# Patient Record
Sex: Female | Born: 2000 | Race: Black or African American | Hispanic: No | Marital: Single | Smoking: Never smoker
Health system: Southern US, Community
[De-identification: ages and names within clinical notes are randomized; demographics above are authoritative.]

## PROBLEM LIST (undated history)

## (undated) ENCOUNTER — Emergency Department (HOSPITAL_COMMUNITY): Admission: EM | Payer: Federal, State, Local not specified - PPO | Source: Home / Self Care

## (undated) DIAGNOSIS — Z789 Other specified health status: Secondary | ICD-10-CM

## (undated) HISTORY — PX: NO PAST SURGERIES: SHX2092

---

## 2018-10-09 ENCOUNTER — Emergency Department (HOSPITAL_COMMUNITY): Payer: Federal, State, Local not specified - PPO

## 2018-10-09 ENCOUNTER — Encounter (HOSPITAL_COMMUNITY): Payer: Self-pay

## 2018-10-09 ENCOUNTER — Other Ambulatory Visit: Payer: Self-pay

## 2018-10-09 ENCOUNTER — Emergency Department (HOSPITAL_COMMUNITY)
Admission: EM | Admit: 2018-10-09 | Discharge: 2018-10-09 | Disposition: A | Discharge status: Home / Self Care | Payer: Federal, State, Local not specified - PPO | Attending: Emergency Medicine | Admitting: Emergency Medicine

## 2018-10-09 DIAGNOSIS — Z3A14 14 weeks gestation of pregnancy: Secondary | ICD-10-CM | POA: Insufficient documentation

## 2018-10-09 DIAGNOSIS — O209 Hemorrhage in early pregnancy, unspecified: Secondary | ICD-10-CM | POA: Diagnosis present

## 2018-10-09 DIAGNOSIS — O039 Complete or unspecified spontaneous abortion without complication: Secondary | ICD-10-CM

## 2018-10-09 LAB — CBC
HCT: 35 % — ABNORMAL LOW (ref 36.0–46.0)
Hemoglobin: 11.1 g/dL — ABNORMAL LOW (ref 12.0–15.0)
MCH: 30 pg (ref 26.0–34.0)
MCHC: 31.7 g/dL (ref 30.0–36.0)
MCV: 94.6 fL (ref 80.0–100.0)
Platelets: 277 10*3/uL (ref 150–400)
RBC: 3.7 MIL/uL — ABNORMAL LOW (ref 3.87–5.11)
RDW: 12.9 % (ref 11.5–15.5)
WBC: 4 10*3/uL (ref 4.0–10.5)
nRBC: 0 % (ref 0.0–0.2)

## 2018-10-09 LAB — I-STAT BETA HCG BLOOD, ED (MC, WL, AP ONLY): I-stat hCG, quantitative: >2000 m[IU]/mL — ABNORMAL HIGH (ref <5)

## 2018-10-09 NOTE — Discharge Instructions (Addendum)
Please follow-up with your obstetrician tomorrow as scheduled.  These return to the hospital for any new or worsening symptoms including worsening vaginal bleeding or development of severe abdominal pain

## 2018-10-09 NOTE — ED Notes (Signed)
Pt discharged from ED; instructions provided; Pt encouraged to return to ED if symptoms worsen and to f/u with PCP; Pt verbalized understanding of all instructions 

## 2018-10-09 NOTE — ED Triage Notes (Signed)
Pt arrives with c/o sudden onset of severe abd cramping and heavy vaginal bleeding that started at approx 6a.

## 2018-10-09 NOTE — ED Provider Notes (Signed)
[facility] EMERGENCY DEPARTMENT Provider Note   CSN: [id number] Arrival date & time: 10/09/18  2952     History   Chief Complaint Chief Complaint  Patient presents with  . Vaginal Bleeding  . Abdominal Pain    HPI Allison Williamson is a 18 y.o. female.  HPI Patient is a 18 year old G1, P0 female presenting to the emergency department with vaginal bleeding and abdominal cramping.  Based on a first trimester ultrasound obtained at the [facility] hospital on 09/12/2018 she is currently 14 weeks and 1 day today.  She reports passage of bright red blood and blood clots since last night.  She feels like her bleeding is slowing down.  Expected date of delivery based on that first trimester ultrasound is April 08, 2019.  No vomiting.  No fevers.  No chills.   History reviewed. No pertinent past medical history.  There are no active problems to display for this patient.   ** The histories are not reviewed yet. Please review them in the "History" navigator section and refresh this SmartLink.   OB History    Gravida  1   Para      Term      Preterm      AB      Living        SAB      TAB      Ectopic      Multiple      Live Births               Home Medications    Prior to Admission medications   Not on File    Family History History reviewed. No pertinent family history.  Social History Social History   Tobacco Use  . Smoking status: Not on file  Substance Use Topics  . Alcohol use: Not on file  . Drug use: Not on file     Allergies   Patient has no allergy information on record.   Review of Systems Review of Systems  All other systems reviewed and are negative.    Physical Exam Updated Vital Signs BP (!) 97/56   Pulse 77   SpO2 100%   Physical Exam Vitals signs and nursing note reviewed.  Constitutional:      Appearance: She is well-developed.  HENT:     Head: Normocephalic.  Neck:     Musculoskeletal:  Normal range of motion.  Cardiovascular:     Rate and Rhythm: Normal rate.  Pulmonary:     Effort: Pulmonary effort is normal.  Abdominal:     General: There is no distension.     Palpations: Abdomen is soft. There is no mass.  Musculoskeletal: Normal range of motion.  Neurological:     Mental Status: She is alert and oriented to person, place, and time.      ED Treatments / Results  Labs (all labs ordered are listed, but only abnormal results are displayed) Labs Reviewed  CBC - Abnormal; Notable for the following components:      Result Value   RBC 3.70 (*)    Hemoglobin 11.1 (*)    HCT 35.0 (*)    All other components within normal limits  I-STAT BETA HCG BLOOD, ED ([facility], [facility], [facility] ONLY) - Abnormal; Notable for the following components:   I-stat hCG, quantitative >2,000.0 (*)    All other components within normal limits    EKG None  Radiology US Ob Comp < 14 Wks  Result Date: 10/09/2018 CLINICAL DATA:  Vaginal bleeding EXAM: OBSTETRIC <14 WK [place] AND TRANSVAGINAL OB US TECHNIQUE: Both transabdominal and transvaginal ultrasound examinations were performed for complete evaluation of the gestation as well as the maternal uterus, adnexal regions, and pelvic cul-de-sac. Transvaginal technique was performed to assess early pregnancy. COMPARISON:  None. FINDINGS: Intrauterine gestational sac: None Yolk sac:  Not visualized Embryo:  Not visualized Cardiac Activity: Not visualized Subchorionic hemorrhage:  None visualized. Maternal uterus/adnexae: Right ovary: Appears normal Left ovary: Small cyst measures 1.3 x 1.3 x 1.3 cm. Other :A small hypoechoic structure within the endometrium measures 1.2 x 0.5 x 0.6 cm. Free fluid:  Trace IMPRESSION: 1. No intrauterine gestational sac, yolk sac, or fetal pole identified. Differential considerations include intrauterine pregnancy too early to be sonographically visualized, missed abortion, or ectopic pregnancy. Followup ultrasound is recommended in  10-14 days for further evaluation. 2. Small hypoechoic structure within the endometrium may be related to patient's heavy vaginal bleeding. Electronically Signed   By: Signa Kell M.D.   On: 10/09/2018 10:46   US Ob Transvaginal  Result Date: 10/09/2018 CLINICAL DATA:  Vaginal bleeding EXAM: OBSTETRIC <14 WK [place] AND TRANSVAGINAL OB US TECHNIQUE: Both transabdominal and transvaginal ultrasound examinations were performed for complete evaluation of the gestation as well as the maternal uterus, adnexal regions, and pelvic cul-de-sac. Transvaginal technique was performed to assess early pregnancy. COMPARISON:  None. FINDINGS: Intrauterine gestational sac: None Yolk sac:  Not visualized Embryo:  Not visualized Cardiac Activity: Not visualized Subchorionic hemorrhage:  None visualized. Maternal uterus/adnexae: Right ovary: Appears normal Left ovary: Small cyst measures 1.3 x 1.3 x 1.3 cm. Other :A small hypoechoic structure within the endometrium measures 1.2 x 0.5 x 0.6 cm. Free fluid:  Trace IMPRESSION: 1. No intrauterine gestational sac, yolk sac, or fetal pole identified. Differential considerations include intrauterine pregnancy too early to be sonographically visualized, missed abortion, or ectopic pregnancy. Followup ultrasound is recommended in 10-14 days for further evaluation. 2. Small hypoechoic structure within the endometrium may be related to patient's heavy vaginal bleeding. Electronically Signed   By: Signa Kell M.D.   On: 10/09/2018 10:46   ++++++++++++++++++++++++++++++++++++++++++++++ [facility] US OB < 14 weeks obtained 09/12/2018 US Ob < 14 Weeks Single Or FIRst Gestation12/30/2019 [facility] Result Impression  IUP + [facility]  Result Narrative  *pt here for new ob dating u/s. LMP = 07/03/18; G=1. Patient denies any spotting  US OB 1st trimester imaging performed:  Single live IUP seen   EDD by LMP = 04/09/19 @ 10.1wks  EDD by CRL = 04/08/19 @ 10.2wks (+/-3 days)  Fetal  heart rate = 175 bpm  Anteverted uterus; no masses seen   Cervix is long and closed at 3.46cm  Subchorionic bleed noted at inferior aspect of gestational sac; measures 1.42cm in greatest dimension  Bilateral ovaries appear normal  No free fluid seen   *pt has initial ob provider visit today with Barnie Alderman, CNM  +++++++++++++++++++++++++++++++++++++++++++++++++++++++++++++++++++++    Procedures Procedures (including critical care time)  Medications Ordered in ED Medications - No data to display   Initial Impression / Assessment and Plan / ED Course  I have reviewed the triage vital signs and the nursing notes.  Pertinent labs & imaging results that were available during my care of the patient were reviewed by me and considered in my medical decision making (see chart for details).     Ultrasound today is consistent with miscarriage.  There is no fetal pole, no intrauterine  gestational sac, yolk sac.  Vital signs are stable.  Based on her ultrasound attained on 09/12/2018 she would be 14 weeks and 1 day.  Patient however is under the belief that she is [redacted] weeks pregnant based on prior imaging obtained by pregnancy care center.  She does not remember however passing what looks like a fetus today . she has just transitioned her care to a [facility] gynecologist [place] [employer].  She has a scheduled appointment tomorrow.  Patient will be discharged home with a copy of her laboratory studies as well as her ultrasound report.  She has been given a copy of her ultrasound on disc for her obstetrician tomorrow.  Final Clinical Impressions(s) / ED Diagnoses   Final diagnoses:  None    ED Discharge Orders    None       Azalia Bilisampos, Shaquon Gropp, MD 10/09/18 1141

## 2018-10-15 ENCOUNTER — Encounter (HOSPITAL_COMMUNITY): Payer: Self-pay

## 2018-10-15 ENCOUNTER — Other Ambulatory Visit: Payer: Self-pay

## 2018-10-15 ENCOUNTER — Emergency Department (HOSPITAL_COMMUNITY)
Admission: EM | Admit: 2018-10-15 | Discharge: 2018-10-15 | Disposition: A | Discharge status: Home / Self Care | Payer: Federal, State, Local not specified - PPO | Attending: Emergency Medicine | Admitting: Emergency Medicine

## 2018-10-15 DIAGNOSIS — O039 Complete or unspecified spontaneous abortion without complication: Secondary | ICD-10-CM | POA: Insufficient documentation

## 2018-10-15 DIAGNOSIS — Z3A00 Weeks of gestation of pregnancy not specified: Secondary | ICD-10-CM | POA: Insufficient documentation

## 2018-10-15 DIAGNOSIS — O209 Hemorrhage in early pregnancy, unspecified: Secondary | ICD-10-CM | POA: Diagnosis present

## 2018-10-15 LAB — URINALYSIS, ROUTINE W REFLEX MICROSCOPIC
BILIRUBIN URINE: NEGATIVE
Glucose, UA: NEGATIVE mg/dL
HGB URINE DIPSTICK: NEGATIVE
Ketones, ur: NEGATIVE mg/dL
Leukocytes, UA: NEGATIVE
Nitrite: NEGATIVE
Protein, ur: NEGATIVE mg/dL
Specific Gravity, Urine: 1.024 (ref 1.005–1.030)
pH: 6 (ref 5.0–8.0)

## 2018-10-15 LAB — CBC
HCT: 39.5 % (ref 36.0–46.0)
Hemoglobin: 12.4 g/dL (ref 12.0–15.0)
MCH: 31.1 pg (ref 26.0–34.0)
MCHC: 31.4 g/dL (ref 30.0–36.0)
MCV: 99 fL (ref 80.0–100.0)
Platelets: 345 10*3/uL (ref 150–400)
RBC: 3.99 MIL/uL (ref 3.87–5.11)
RDW: 13.1 % (ref 11.5–15.5)
WBC: 3.9 10*3/uL — ABNORMAL LOW (ref 4.0–10.5)
nRBC: 0 % (ref 0.0–0.2)

## 2018-10-15 LAB — ABO/RH: ABO/RH(D): O POS

## 2018-10-15 LAB — HCG, QUANTITATIVE, PREGNANCY: hCG, Beta Chain, Quant, S: 202 m[IU]/mL — ABNORMAL HIGH (ref <5)

## 2018-10-15 NOTE — ED Triage Notes (Signed)
Pt telling RN that she is mainly here because she has not been able to sleep.  Cramping from miscarriage has decreased her appetite.  No n/v.  Pt states vaginal bleeding has lessened

## 2018-10-15 NOTE — ED Notes (Signed)
IV removed from right AC

## 2018-10-15 NOTE — ED Provider Notes (Signed)
[facility]-EMERGENCY DEPT Provider Note   CSN: [id number] Arrival date & time: 10/15/18  1051     History   Chief Complaint Chief Complaint  Patient presents with  . Insomnia  . Abdominal Cramping    HPI Allison Williamson is a 18 y.o. female.  HPI Patient reports that vaginal bleeding stopped yesterday.  She reports she still having some lower abdominal cramping and some low back pain.  No nausea no vomiting.  No pain no burning with urination.  Patient was seen 6 days ago for vaginal bleeding and cramping with estimated 14 weeks and 1 days gestation.  At that time she had been passing bright red blood and large clots. History reviewed. No pertinent past medical history.  There are no active problems to display for this patient.   History reviewed. No pertinent surgical history.   OB History    Gravida  1   Para      Term      Preterm      AB      Living        SAB      TAB      Ectopic      Multiple      Live Births               Home Medications    Prior to Admission medications   Not on File    Family History History reviewed. No pertinent family history.  Social History Social History   Tobacco Use  . Smoking status: Never Smoker  . Smokeless tobacco: Never Used  Substance Use Topics  . Alcohol use: Never    Frequency: Never  . Drug use: Never     Allergies   Patient has no known allergies.   Review of Systems Review of Systems 10 Systems reviewed and are negative for acute change except as noted in the HPI.   Physical Exam Updated Vital Signs BP 105/74 (BP Location: Right Arm)   Pulse 86   Temp 98.6 F (37 C) (Oral)   Resp 18   LMP  (LMP Unknown) Comment: recent pregnancy, current miscarriage  SpO2 100%   Physical Exam Constitutional:      Appearance: Normal appearance. She is well-developed.  HENT:     Head: Normocephalic and atraumatic.  Eyes:     Extraocular Movements: Extraocular  movements intact.     Conjunctiva/sclera: Conjunctivae normal.  Neck:     Musculoskeletal: Neck supple.  Cardiovascular:     Rate and Rhythm: Normal rate and regular rhythm.     Heart sounds: Normal heart sounds.  Pulmonary:     Effort: Pulmonary effort is normal.     Breath sounds: Normal breath sounds.  Abdominal:     General: Bowel sounds are normal. There is no distension.     Palpations: Abdomen is soft.     Tenderness: There is no abdominal tenderness.  Musculoskeletal: Normal range of motion.  Skin:    General: Skin is warm and dry.  Neurological:     Mental Status: She is alert and oriented to person, place, and time.     GCS: GCS eye subscore is 4. GCS verbal subscore is 5. GCS motor subscore is 6.     Coordination: Coordination normal.      ED Treatments / Results  Labs (all labs ordered are listed, but only abnormal results are displayed) Labs Reviewed  HCG, QUANTITATIVE, PREGNANCY - Abnormal; Notable for the  following components:      Result Value   hCG, Beta Chain, Quant, S 202 (*)    All other components within normal limits  CBC - Abnormal; Notable for the following components:   WBC 3.9 (*)    All other components within normal limits  URINALYSIS, ROUTINE W REFLEX MICROSCOPIC  ABO/RH    EKG None  Radiology No results found.  Procedures Procedures (including critical care time)  Medications Ordered in ED Medications - No data to display   Initial Impression / Assessment and Plan / ED Course  I have reviewed the triage vital signs and the nursing notes.  Pertinent labs & imaging results that were available during my care of the patient were reviewed by me and considered in my medical decision making (see chart for details).    Patient is clinically well in appearance.  Abdomen is soft and nontender.  hCG has dropped as anticipated.  At this point findings consistent with complete AB.  Patient is no longer bleeding.  She is stable for follow-up  with GYN.  Patient reports she is having trouble falling asleep for about the past week.  We discussed trying melatonin and cognitive techniques.  Patient is to discuss this with PCP if persist beyond the next few days.  Final Clinical Impressions(s) / ED Diagnoses   Final diagnoses:  Complete abortion    ED Discharge Orders    None       Arby Barrette, MD 10/15/18 1556

## 2018-10-15 NOTE — ED Triage Notes (Addendum)
Per EMS, pt from [facility].  [facility]  Seen [facility] for [facility] recently.  Pt has had vaginal bleeding x 8 days.   No fever.  Having abdominal pain.  Vitals:   130/73, hr 100, 100% ra, cbg 88, resp 16.  Pt has + pregnancy in recent visit.

## 2018-10-15 NOTE — Discharge Instructions (Addendum)
1.  See your gynecologist for recheck this week.  If you do not have a gynecologist call the women's outpatient clinic and schedule an appointment as soon as possible. 2.  Return to the emergency department if you get abdominal pain fever or other concerning symptoms. 3.  You may try over-the-counter melatonin for sleep.  Try techniques of relaxation and avoid stimulating activities later in the evening.

## 2018-10-17 ENCOUNTER — Telehealth (HOSPITAL_COMMUNITY): Payer: Self-pay

## 2018-10-25 ENCOUNTER — Encounter: Payer: Self-pay | Admitting: Obstetrics

## 2018-10-25 ENCOUNTER — Ambulatory Visit (INDEPENDENT_AMBULATORY_CARE_PROVIDER_SITE_OTHER): Payer: Federal, State, Local not specified - PPO | Admitting: Obstetrics

## 2018-10-25 VITALS — BP 108/64 | HR 77 | Ht 61.0 in | Wt 121.0 lb

## 2018-10-25 DIAGNOSIS — O039 Complete or unspecified spontaneous abortion without complication: Secondary | ICD-10-CM | POA: Diagnosis not present

## 2018-10-25 DIAGNOSIS — Z72820 Sleep deprivation: Secondary | ICD-10-CM

## 2018-10-25 MED ORDER — ZOLPIDEM TARTRATE 5 MG PO TABS: 5.0000 mg | ORAL_TABLET | Freq: Every evening | ORAL | 0 refills | Status: DC | PRN

## 2018-10-25 NOTE — Progress Notes (Signed)
NGYN pt presents for annual and f/u after recent SAB.  Pt does not desire BC at this time.  Pt requests medication for insomnia.

## 2018-10-25 NOTE — Progress Notes (Signed)
Subjective:     Allison Williamson is a 18 y.o. female who presents for a postpartum visit. She is 10 days postpartum following a SAB AT 14 WEEKS. I have fully reviewed the prenatal and intrapartum course. The delivery was at 14 gestational weeks. Outcome: SAB, complete. Anesthesia: none. Postpartum course has been normal.. Bleeding no bleeding. Bowel function is normal. Bladder function is normal. Patient is not sexually active. Contraception method is abstinence. Postpartum depression screening: negative.  Tobacco, alcohol and substance abuse history reviewed.  Adult immunizations reviewed including TDAP, rubella and varicella.  The following portions of the patient's history were reviewed and updated as appropriate: allergies, current medications, past family history, past medical history, past social history, past surgical history and problem list.  Review of Systems A comprehensive review of systems was negative.   Objective:    BP 108/64   Pulse 77   Ht 5\' 1"  (1.549 m)   Wt 121 lb (54.9 kg)   LMP 07/03/2018 Comment: recent pregnancy, current miscarriage  Breastfeeding Unknown   BMI 22.86 kg/m    PE:  Deferred   50% of 15 min visit spent on counseling and coordination of care  1. SAB (spontaneous abortion) - Complete  2. Sleep deprivation Rx: - zolpidem (AMBIEN) 5 MG tablet; Take 1 tablet (5 mg total) by mouth at bedtime as needed for sleep.  Dispense: 30 tablet; Refill: 0   Plan:    1. Contraception: abstinence.  Considering IUD. 2. Continue PNV's 3. Follow up in: 2 weeks or as needed.    Brock Bad MD 10-25-2018

## 2018-11-22 ENCOUNTER — Ambulatory Visit (INDEPENDENT_AMBULATORY_CARE_PROVIDER_SITE_OTHER): Payer: Federal, State, Local not specified - PPO | Admitting: Obstetrics

## 2018-11-22 ENCOUNTER — Encounter: Payer: Self-pay | Admitting: Obstetrics

## 2018-11-22 VITALS — BP 110/76 | HR 80 | Ht 62.0 in | Wt 125.0 lb

## 2018-11-22 DIAGNOSIS — O039 Complete or unspecified spontaneous abortion without complication: Secondary | ICD-10-CM | POA: Diagnosis not present

## 2018-11-22 DIAGNOSIS — Z3009 Encounter for other general counseling and advice on contraception: Secondary | ICD-10-CM | POA: Diagnosis not present

## 2018-11-22 NOTE — Progress Notes (Signed)
Patient ID: Allison Williamson, female   DOB: 2001-02-01, 18 y.o.   MRN: [id number]  No chief complaint on file.   HPI Allison Williamson is a 18 y.o. female.  History of 14 week SAB on 10-09-2018.  Presents for follow up.  No complaints. HPI  No past medical history on file.  No past surgical history on file.  Family History  Problem Relation Age of Onset  . Diabetes Mother   . Hypertension Mother     Social History Social History   Tobacco Use  . Smoking status: Never Smoker  . Smokeless tobacco: Never Used  Substance Use Topics  . Alcohol use: Never    Frequency: Never  . Drug use: Never    No Known Allergies  Current Outpatient Medications  Medication Sig Dispense Refill  . zolpidem (AMBIEN) 5 MG tablet Take 1 tablet (5 mg total) by mouth at bedtime as needed for sleep. 30 tablet 0   No current facility-administered medications for this visit.     Review of Systems Review of Systems Constitutional: negative for fatigue and weight loss Respiratory: negative for cough and wheezing Cardiovascular: negative for chest pain, fatigue and palpitations Gastrointestinal: negative for abdominal pain and change in bowel habits Genitourinary:negative Integument/breast: negative for nipple discharge Musculoskeletal:negative for myalgias Neurological: negative for gait problems and tremors Behavioral/Psych: negative for abusive relationship, depression Endocrine: negative for temperature intolerance      Blood pressure 110/76, pulse 80, height 5\' 2"  (1.575 m), weight 125 lb (56.7 kg), last menstrual period 11/09/2018, unknown if currently breastfeeding.  Physical Exam Physical Exam:  Deferred 50% of 15 min visit spent on counseling and coordination of care.   Data Reviewed Labs Ultrasound  Assessment     1. SAB (spontaneous abortion)  2. Encounter for other general counseling and advice on contraception - wants Nexplanon    Plan    Follow up in 2  weeks for Nexplanon Insertion  No orders of the defined types were placed in this encounter.  No orders of the defined types were placed in this encounter.   Brock Bad MD 11-22-2018

## 2018-11-22 NOTE — Progress Notes (Signed)
Has been sexually active last weekend Patient follow up from SAB. Interested in birth control. Armandina Stammer RN

## 2018-12-06 ENCOUNTER — Ambulatory Visit: Payer: Federal, State, Local not specified - PPO | Admitting: Obstetrics

## 2019-07-12 ENCOUNTER — Other Ambulatory Visit: Payer: Self-pay

## 2019-07-12 DIAGNOSIS — Y93I9 Activity, other involving external motion: Secondary | ICD-10-CM | POA: Diagnosis not present

## 2019-07-12 DIAGNOSIS — Y92410 Unspecified street and highway as the place of occurrence of the external cause: Secondary | ICD-10-CM | POA: Diagnosis not present

## 2019-07-12 DIAGNOSIS — S4992XA Unspecified injury of left shoulder and upper arm, initial encounter: Secondary | ICD-10-CM | POA: Diagnosis not present

## 2019-07-12 DIAGNOSIS — M25512 Pain in left shoulder: Secondary | ICD-10-CM | POA: Diagnosis not present

## 2019-07-12 DIAGNOSIS — M542 Cervicalgia: Secondary | ICD-10-CM | POA: Diagnosis not present

## 2019-07-12 DIAGNOSIS — Y999 Unspecified external cause status: Secondary | ICD-10-CM | POA: Diagnosis not present

## 2019-07-12 LAB — CBG MONITORING, ED: Glucose-Capillary: 333 mg/dL — ABNORMAL HIGH (ref 70–99)

## 2019-07-13 ENCOUNTER — Emergency Department (HOSPITAL_BASED_OUTPATIENT_CLINIC_OR_DEPARTMENT_OTHER): Payer: Federal, State, Local not specified - PPO

## 2019-07-13 ENCOUNTER — Encounter (HOSPITAL_BASED_OUTPATIENT_CLINIC_OR_DEPARTMENT_OTHER): Payer: Self-pay | Admitting: *Deleted

## 2019-07-13 ENCOUNTER — Other Ambulatory Visit: Payer: Self-pay

## 2019-07-13 ENCOUNTER — Emergency Department (HOSPITAL_BASED_OUTPATIENT_CLINIC_OR_DEPARTMENT_OTHER)
Admission: EM | Admit: 2019-07-13 | Discharge: 2019-07-13 | Disposition: A | Discharge status: Home / Self Care | Payer: Federal, State, Local not specified - PPO | Attending: Emergency Medicine | Admitting: Emergency Medicine

## 2019-07-13 DIAGNOSIS — M542 Cervicalgia: Secondary | ICD-10-CM | POA: Diagnosis not present

## 2019-07-13 DIAGNOSIS — M25512 Pain in left shoulder: Secondary | ICD-10-CM | POA: Diagnosis not present

## 2019-07-13 DIAGNOSIS — Y92410 Unspecified street and highway as the place of occurrence of the external cause: Secondary | ICD-10-CM | POA: Insufficient documentation

## 2019-07-13 DIAGNOSIS — Y999 Unspecified external cause status: Secondary | ICD-10-CM | POA: Insufficient documentation

## 2019-07-13 DIAGNOSIS — S4992XA Unspecified injury of left shoulder and upper arm, initial encounter: Secondary | ICD-10-CM | POA: Diagnosis not present

## 2019-07-13 DIAGNOSIS — Y93I9 Activity, other involving external motion: Secondary | ICD-10-CM | POA: Insufficient documentation

## 2019-07-13 MED ORDER — MELOXICAM 7.5 MG PO TABS: 7.5000 mg | ORAL_TABLET | Freq: Every day | ORAL | 0 refills | Status: AC | PRN

## 2019-07-13 MED ORDER — CYCLOBENZAPRINE HCL 10 MG PO TABS: 10.0000 mg | ORAL_TABLET | Freq: Every day | ORAL | 0 refills | Status: DC

## 2019-07-13 MED FILL — MELOXICAM 7.5 MG TABLET: 7.5 | 7 days supply | Qty: 7 | Fill #0

## 2019-07-13 MED FILL — CYCLOBENZAPRINE HCL 10 MG T: 10 | 10 days supply | Qty: 10 | Fill #0

## 2019-07-13 NOTE — Discharge Instructions (Signed)
Please take the muscle relaxer and Mobic as prescribed.  If pain uncontrolled with Mobic, you may take Tylenol for additional relief.  Do not add on ibuprofen or similar NSAID in addition to the Mobic.   You were given a prescription for Flexeril which is a muscle relaxer.  You should not drive, work, consume alcohol, or operate machinery while taking this medication as it can make you very drowsy.    Return to the ED or seek medical attention should you develop any significant headache, blurred vision, nausea and vomiting, chest pain or shortness of breath, new neurologic deficits, or any other new or worsening symptoms.

## 2019-07-13 NOTE — ED Notes (Signed)
ED Provider at bedside. 
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
## 2019-07-16 ENCOUNTER — Ambulatory Visit (INDEPENDENT_AMBULATORY_CARE_PROVIDER_SITE_OTHER)
Admission: RE | Admit: 2019-07-16 | Discharge: 2019-07-16 | Disposition: A | Discharge status: Home / Self Care | Payer: Federal, State, Local not specified - PPO | Source: Ambulatory Visit

## 2019-07-16 DIAGNOSIS — S46812A Strain of other muscles, fascia and tendons at shoulder and upper arm level, left arm, initial encounter: Secondary | ICD-10-CM | POA: Diagnosis not present

## 2019-07-16 MED ORDER — NAPROXEN 500 MG PO TABS: 500.0000 mg | ORAL_TABLET | Freq: Two times a day (BID) | ORAL | 0 refills | Status: DC

## 2019-07-16 NOTE — Discharge Instructions (Signed)
Naprosyn twice daily Continue flexeril/cyclobenazprine. You may use flexeril as needed to help with pain. This is a muscle relaxer and causes sedation- please use only at bedtime or when you will be home and not have to drive/work Alternate ice and heat Gentle stretches/exercises- see attached
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
## 2019-10-03 DIAGNOSIS — F4323 Adjustment disorder with mixed anxiety and depressed mood: Secondary | ICD-10-CM | POA: Diagnosis not present

## 2019-10-03 DIAGNOSIS — F411 Generalized anxiety disorder: Secondary | ICD-10-CM | POA: Diagnosis not present

## 2019-10-10 DIAGNOSIS — F4323 Adjustment disorder with mixed anxiety and depressed mood: Secondary | ICD-10-CM | POA: Diagnosis not present

## 2019-10-17 DIAGNOSIS — F411 Generalized anxiety disorder: Secondary | ICD-10-CM | POA: Diagnosis not present

## 2019-11-02 DIAGNOSIS — F411 Generalized anxiety disorder: Secondary | ICD-10-CM | POA: Diagnosis not present

## 2019-11-07 DIAGNOSIS — F411 Generalized anxiety disorder: Secondary | ICD-10-CM | POA: Diagnosis not present

## 2019-11-16 ENCOUNTER — Other Ambulatory Visit: Payer: Self-pay

## 2019-11-16 ENCOUNTER — Other Ambulatory Visit (HOSPITAL_COMMUNITY)
Admission: RE | Admit: 2019-11-16 | Discharge: 2019-11-16 | Disposition: A | Discharge status: Home / Self Care | Payer: Federal, State, Local not specified - PPO | Source: Ambulatory Visit | Attending: Obstetrics | Admitting: Obstetrics

## 2019-11-16 ENCOUNTER — Ambulatory Visit (INDEPENDENT_AMBULATORY_CARE_PROVIDER_SITE_OTHER): Payer: Federal, State, Local not specified - PPO | Admitting: *Deleted

## 2019-11-16 DIAGNOSIS — Z113 Encounter for screening for infections with a predominantly sexual mode of transmission: Secondary | ICD-10-CM | POA: Insufficient documentation

## 2019-11-16 NOTE — Progress Notes (Signed)
Pt is in office today for STD screening with lab work.  Pt has no vaginal complaints today.   Pt preformed self swab and had labs drawn today.  Pt advised that she will be contacted with any abnormal results.   Pt has no other concerns.

## 2019-11-16 NOTE — Progress Notes (Signed)
Agree with A & P. 

## 2019-11-17 LAB — HIV ANTIBODY (ROUTINE TESTING W REFLEX): HIV Screen 4th Generation wRfx: NONREACTIVE

## 2019-11-17 LAB — RPR: RPR Ser Ql: NONREACTIVE

## 2019-11-17 LAB — CERVICOVAGINAL ANCILLARY ONLY
Chlamydia: NEGATIVE
Comment: NEGATIVE
Comment: NEGATIVE
Comment: NORMAL
Neisseria Gonorrhea: NEGATIVE
Trichomonas: NEGATIVE

## 2019-11-17 LAB — HEPATITIS C ANTIBODY: Hep C Virus Ab: <0.1 s/co ratio (ref 0.0–0.9)

## 2019-11-17 LAB — HEPATITIS B SURFACE ANTIGEN: Hepatitis B Surface Ag: NEGATIVE

## 2019-11-22 DIAGNOSIS — F411 Generalized anxiety disorder: Secondary | ICD-10-CM | POA: Diagnosis not present

## 2019-12-08 DIAGNOSIS — F411 Generalized anxiety disorder: Secondary | ICD-10-CM | POA: Diagnosis not present

## 2019-12-21 DIAGNOSIS — F411 Generalized anxiety disorder: Secondary | ICD-10-CM | POA: Diagnosis not present

## 2019-12-26 ENCOUNTER — Encounter: Payer: Self-pay | Admitting: Obstetrics

## 2019-12-26 ENCOUNTER — Telehealth (INDEPENDENT_AMBULATORY_CARE_PROVIDER_SITE_OTHER): Payer: Federal, State, Local not specified - PPO | Admitting: Obstetrics

## 2019-12-26 DIAGNOSIS — Z3009 Encounter for other general counseling and advice on contraception: Secondary | ICD-10-CM | POA: Diagnosis not present

## 2019-12-26 DIAGNOSIS — Z30011 Encounter for initial prescription of contraceptive pills: Secondary | ICD-10-CM | POA: Diagnosis not present

## 2019-12-26 MED ORDER — ORTHO-NOVUM 1/35 (28) 1-35 MG-MCG PO TABS: 1.0000 | ORAL_TABLET | Freq: Every day | ORAL | 11 refills | Status: DC

## 2019-12-26 NOTE — Progress Notes (Addendum)
   TELEHEALTH VIRTUAL GYNECOLOGY VISIT ENCOUNTER NOTE  Provider Location:  Center for [employer], [facility]  I connected with Allison Williamson on 12/26/19 at 10:30 AM EDT by telephone at home and verified that I am speaking with the correct person using two identifiers.   I discussed the limitations, risks, security and privacy concerns of performing an evaluation and management service by telephone and the availability of in person appointments. I also discussed with the patient that there may be a patient responsible charge related to this service. The patient expressed understanding and agreed to proceed.   History:  Allison Williamson is a 19 y.o. G64P0010 female being evaluated today for contraceptive counseling and advice, and intiation   She is interested in OCP's.  She denies any abnormal vaginal discharge, bleeding, pelvic pain or other concerns.       History reviewed. No pertinent past medical history. History reviewed. No pertinent surgical history. The following portions of the patient's history were reviewed and updated as appropriate: allergies, current medications, past family history, past medical history, past social history, past surgical history and problem list.   Health Maintenance:  Normal pap and negative HRHPV on n/a.  Normal mammogram on n/a.   Review of Systems:  Pertinent items noted in HPI and remainder of comprehensive ROS otherwise negative.  Physical Exam:   General:  Alert, oriented and cooperative.   Mental Status: Normal mood and affect perceived. Normal judgment and thought content.  Physical exam deferred due to nature of the encounter  Labs and Imaging No results found for this or any previous visit (from the past 336 hour(s)). No results found.    Assessment and Plan:     1. Encounter for other general counseling and advice on contraception - interested in starting OCP's  2. Encounter for initial prescription of contraceptive  pills Rx: - norethindrone-ethinyl estradiol 1/35 (ORTHO-NOVUM 1/35, 28,) tablet; Take 1 tablet by mouth daily.  Dispense: 1 Package; Refill: 11       I discussed the assessment and treatment plan with the patient. The patient was provided an opportunity to ask questions and all were answered. The patient agreed with the plan and demonstrated an understanding of the instructions.   The patient was advised to call back or seek an in-person evaluation/go to the ED if the symptoms worsen or if the condition fails to improve as anticipated.  I provided 15 minutes of non-face-to-face time during this encounter.   Coral Ceo, MD Center for [facility], [facility] 12/26/2019

## 2020-01-03 ENCOUNTER — Telehealth: Payer: Self-pay

## 2020-01-03 NOTE — Telephone Encounter (Signed)
TC from pt requesting OOW note for this weekend due to fatigue, and headaches.  Pt advised to f/u with her PCP /urgent care.  Pt last seen last week virtually for birth control and new sx's where not addressed. Pt voiced understanding.

## 2020-01-12 DIAGNOSIS — F411 Generalized anxiety disorder: Secondary | ICD-10-CM | POA: Diagnosis not present

## 2020-01-31 DIAGNOSIS — F411 Generalized anxiety disorder: Secondary | ICD-10-CM | POA: Diagnosis not present

## 2020-02-13 DIAGNOSIS — F411 Generalized anxiety disorder: Secondary | ICD-10-CM | POA: Diagnosis not present

## 2020-02-16 DIAGNOSIS — Z114 Encounter for screening for human immunodeficiency virus [HIV]: Secondary | ICD-10-CM | POA: Diagnosis not present

## 2020-02-16 DIAGNOSIS — Z113 Encounter for screening for infections with a predominantly sexual mode of transmission: Secondary | ICD-10-CM | POA: Diagnosis not present

## 2020-02-19 DIAGNOSIS — Z113 Encounter for screening for infections with a predominantly sexual mode of transmission: Secondary | ICD-10-CM | POA: Diagnosis not present

## 2020-03-19 DIAGNOSIS — F4320 Adjustment disorder, unspecified: Secondary | ICD-10-CM | POA: Diagnosis not present

## 2020-04-07 DIAGNOSIS — R05 Cough: Secondary | ICD-10-CM | POA: Diagnosis not present

## 2020-04-07 DIAGNOSIS — H6503 Acute serous otitis media, bilateral: Secondary | ICD-10-CM | POA: Diagnosis not present

## 2020-04-07 DIAGNOSIS — J209 Acute bronchitis, unspecified: Secondary | ICD-10-CM | POA: Diagnosis not present

## 2020-04-07 DIAGNOSIS — Z20822 Contact with and (suspected) exposure to covid-19: Secondary | ICD-10-CM | POA: Diagnosis not present

## 2020-04-12 ENCOUNTER — Other Ambulatory Visit (HOSPITAL_COMMUNITY)
Admission: RE | Admit: 2020-04-12 | Discharge: 2020-04-12 | Disposition: A | Discharge status: Home / Self Care | Payer: Federal, State, Local not specified - PPO | Source: Ambulatory Visit | Attending: Obstetrics | Admitting: Obstetrics

## 2020-04-12 ENCOUNTER — Other Ambulatory Visit: Payer: Self-pay

## 2020-04-12 ENCOUNTER — Ambulatory Visit: Payer: Federal, State, Local not specified - PPO

## 2020-04-12 DIAGNOSIS — N898 Other specified noninflammatory disorders of vagina: Secondary | ICD-10-CM

## 2020-04-12 NOTE — Progress Notes (Signed)
Pt is in the office requesting std testing

## 2020-04-13 LAB — RPR: RPR Ser Ql: NONREACTIVE

## 2020-04-13 LAB — HEPATITIS B SURFACE ANTIGEN: Hepatitis B Surface Ag: NEGATIVE

## 2020-04-13 LAB — HEPATITIS C ANTIBODY: Hep C Virus Ab: <0.1 s/co ratio (ref 0.0–0.9)

## 2020-04-13 LAB — HIV ANTIBODY (ROUTINE TESTING W REFLEX): HIV Screen 4th Generation wRfx: NONREACTIVE

## 2020-04-15 ENCOUNTER — Other Ambulatory Visit: Payer: Self-pay | Admitting: Obstetrics & Gynecology

## 2020-04-15 DIAGNOSIS — N898 Other specified noninflammatory disorders of vagina: Secondary | ICD-10-CM

## 2020-04-15 LAB — CERVICOVAGINAL ANCILLARY ONLY
Bacterial Vaginitis (gardnerella): POSITIVE — AB
Candida Glabrata: NEGATIVE
Candida Vaginitis: NEGATIVE
Chlamydia: NEGATIVE
Comment: NEGATIVE
Comment: NEGATIVE
Comment: NEGATIVE
Comment: NEGATIVE
Comment: NEGATIVE
Comment: NORMAL
Neisseria Gonorrhea: NEGATIVE
Trichomonas: NEGATIVE

## 2020-04-15 MED ORDER — METRONIDAZOLE 500 MG PO TABS: 500.0000 mg | ORAL_TABLET | Freq: Two times a day (BID) | ORAL | 0 refills | Status: AC

## 2020-04-15 NOTE — Progress Notes (Signed)
Patient ID: Allison Williamson, female   DOB: 23-Aug-2001, 18 y.o.   MRN: [id number] Patient was assessed and managed by nursing staff during this encounter. I have reviewed the chart and agree with the documentation and plan. I have also made any necessary editorial changes.  Scheryl Darter, MD 04/15/2020 3:17 PM

## 2020-04-16 DIAGNOSIS — F4320 Adjustment disorder, unspecified: Secondary | ICD-10-CM | POA: Diagnosis not present

## 2020-04-30 DIAGNOSIS — F4320 Adjustment disorder, unspecified: Secondary | ICD-10-CM | POA: Diagnosis not present

## 2020-05-20 DIAGNOSIS — R05 Cough: Secondary | ICD-10-CM | POA: Diagnosis not present

## 2020-05-20 DIAGNOSIS — Z20828 Contact with and (suspected) exposure to other viral communicable diseases: Secondary | ICD-10-CM | POA: Diagnosis not present

## 2020-05-20 DIAGNOSIS — J029 Acute pharyngitis, unspecified: Secondary | ICD-10-CM | POA: Diagnosis not present

## 2020-05-21 DIAGNOSIS — F4320 Adjustment disorder, unspecified: Secondary | ICD-10-CM | POA: Diagnosis not present

## 2020-06-04 DIAGNOSIS — F320 Major depressive disorder, single episode, mild: Secondary | ICD-10-CM | POA: Diagnosis not present

## 2020-06-21 DIAGNOSIS — F320 Major depressive disorder, single episode, mild: Secondary | ICD-10-CM | POA: Diagnosis not present

## 2020-07-12 DIAGNOSIS — F4320 Adjustment disorder, unspecified: Secondary | ICD-10-CM | POA: Diagnosis not present

## 2020-08-05 DIAGNOSIS — F4320 Adjustment disorder, unspecified: Secondary | ICD-10-CM | POA: Diagnosis not present

## 2020-08-20 DIAGNOSIS — F4320 Adjustment disorder, unspecified: Secondary | ICD-10-CM | POA: Diagnosis not present

## 2020-09-05 DIAGNOSIS — F4320 Adjustment disorder, unspecified: Secondary | ICD-10-CM | POA: Diagnosis not present

## 2020-09-14 NOTE — L&D Delivery Note (Addendum)
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  Delivery of a Live born female  Birth Weight: 3572 grams/  7 lbs 14 oz APGAR: 7, 9  Newborn Delivery   Birth date/time: 08/03/2021 17:17:00 Delivery type: Vaginal, Spontaneous      in cephalic presentation, LOA.  Once head was delivered, fetus restituted however did not have delivery of anterior shoulder with pushing. At that time Dr. Ephriam Jenkins, MD to delivery posterior axilla and downward traction done and baby delivered and placed onto mom's chest.   APGAR:1 min-7 , 5 min-9   Nuchal Cord: No  Cord double clamped after cessation of pulsation, cut by grandmother.  Collection of cord blood for typing completed. Cord blood donation-None  Arterial cord blood sample-No    Placenta delivered-Spontaneous  with 3 vessels . Uterotonics: IV Pitocin Placenta to L&D Uterine tone Firm  Bleeding scant  1st degree;Periurethral  laceration identified.  Episiotomy:None  Local analgesia: N/A  Repair: 1st degree-3.0 vicryl CT, well approximated, periurethral- 3.0 vicryl SH Est. Blood Loss (mL):50.00   Complications: None  Mom to postpartum.  Baby Kassie to Couplet care / Skin to Skin.  Delivery Report:  Review the Delivery Report for details.    Iline Oven [place], SNM 08/03/2021, 6:24 PM   GME ATTESTATION:  I saw and evaluated the patient. I agree with the findings and the plan of care as documented in the student's note and I was present and gloved  for entire delivery and repair.   Warner Mccreedy, MD, MPH OB Fellow, Faculty Practice [facility], Center for [facility] 08/03/2021 7:25 PM

## 2020-09-16 DIAGNOSIS — F4320 Adjustment disorder, unspecified: Secondary | ICD-10-CM | POA: Diagnosis not present

## 2020-11-07 DIAGNOSIS — F4320 Adjustment disorder, unspecified: Secondary | ICD-10-CM | POA: Diagnosis not present

## 2021-01-05 ENCOUNTER — Emergency Department (HOSPITAL_BASED_OUTPATIENT_CLINIC_OR_DEPARTMENT_OTHER)
Admission: EM | Admit: 2021-01-05 | Discharge: 2021-01-05 | Disposition: A | Discharge status: Other Acute Inpatient Hospital | Payer: Federal, State, Local not specified - PPO

## 2021-01-05 ENCOUNTER — Other Ambulatory Visit: Payer: Self-pay

## 2021-01-05 NOTE — Care Management (Signed)
Patient in for COVID testing, DM history, needs to establish PCP. Will need to call Monday to set up, in patient instructions

## 2021-01-09 ENCOUNTER — Ambulatory Visit (INDEPENDENT_AMBULATORY_CARE_PROVIDER_SITE_OTHER): Payer: Federal, State, Local not specified - PPO

## 2021-01-09 ENCOUNTER — Other Ambulatory Visit: Payer: Self-pay

## 2021-01-09 DIAGNOSIS — Z3A10 10 weeks gestation of pregnancy: Secondary | ICD-10-CM

## 2021-01-09 DIAGNOSIS — Z3491 Encounter for supervision of normal pregnancy, unspecified, first trimester: Secondary | ICD-10-CM | POA: Insufficient documentation

## 2021-01-09 DIAGNOSIS — Z3481 Encounter for supervision of other normal pregnancy, first trimester: Secondary | ICD-10-CM

## 2021-01-09 DIAGNOSIS — Z3490 Encounter for supervision of normal pregnancy, unspecified, unspecified trimester: Secondary | ICD-10-CM

## 2021-01-09 DIAGNOSIS — F4323 Adjustment disorder with mixed anxiety and depressed mood: Secondary | ICD-10-CM | POA: Diagnosis not present

## 2021-01-09 HISTORY — DX: Encounter for supervision of normal pregnancy, unspecified, unspecified trimester: Z34.90

## 2021-01-09 MED ORDER — BLOOD PRESSURE KIT DEVI: 1.0000 | 0 refills | Status: AC

## 2021-01-09 NOTE — Progress Notes (Signed)
Patient was assessed and managed by nursing staff during this encounter. I have reviewed the chart and agree with the documentation and plan. I have also made any necessary editorial changes.  Radie Berges, MD 01/09/2021 3:48 PM    

## 2021-01-09 NOTE — Progress Notes (Signed)
PRENATAL INTAKE SUMMARY  [name] presents today New OB Nurse Interview.  OB History    Gravida  2   Para      Term      Preterm      AB  1   Living        SAB  1   IAB      Ectopic      Multiple      Live Births             I have reviewed the patient's medical, obstetrical, social, and family histories, medications, and available lab results.  SUBJECTIVE She has no unusual complaints  OBJECTIVE Initial Physical Exam (New OB)  GENERAL APPEARANCE: alert, well appearing   ASSESSMENT Normal pregnancy  PLAN Prenatal care to be completed at [place] labs to be completed at [facility] provider visit Blood pressure kit ordered Baby Scripts ordered PHQ 9 score: 4 GAD 7 score: 3

## 2021-01-13 ENCOUNTER — Telehealth: Payer: Self-pay

## 2021-01-13 NOTE — Telephone Encounter (Signed)
Pt had questions about what medications she could take for her allergies. States she is having HA's from allergies and did not know what medications were safe to take while pregnant. Advised ok for claritin/zyrtec or could take benadryl at night if needs help with sleep. Also could use flonase nasal spray or saline nasal spray if needed. Can ask about any further treatments at NOB visit on Thursday. Pt agreed and verbalized understanding.

## 2021-01-16 ENCOUNTER — Other Ambulatory Visit: Payer: Self-pay

## 2021-01-16 ENCOUNTER — Ambulatory Visit (INDEPENDENT_AMBULATORY_CARE_PROVIDER_SITE_OTHER): Payer: Federal, State, Local not specified - PPO | Admitting: Nurse Practitioner

## 2021-01-16 ENCOUNTER — Other Ambulatory Visit (HOSPITAL_COMMUNITY)
Admission: RE | Admit: 2021-01-16 | Discharge: 2021-01-16 | Disposition: A | Discharge status: Home / Self Care | Payer: Federal, State, Local not specified - PPO | Source: Ambulatory Visit | Attending: Nurse Practitioner | Admitting: Nurse Practitioner

## 2021-01-16 ENCOUNTER — Encounter: Payer: Self-pay | Admitting: Nurse Practitioner

## 2021-01-16 VITALS — BP 120/76 | HR 90 | Wt 130.0 lb

## 2021-01-16 DIAGNOSIS — Z3A11 11 weeks gestation of pregnancy: Secondary | ICD-10-CM | POA: Diagnosis not present

## 2021-01-16 DIAGNOSIS — T7840XA Allergy, unspecified, initial encounter: Secondary | ICD-10-CM

## 2021-01-16 DIAGNOSIS — Z3481 Encounter for supervision of other normal pregnancy, first trimester: Secondary | ICD-10-CM | POA: Insufficient documentation

## 2021-01-16 MED ORDER — ZYRTEC ALLERGY 10 MG PO CAPS: 1.0000 | ORAL_CAPSULE | Freq: Every day | ORAL | 2 refills | Status: DC

## 2021-01-16 NOTE — Progress Notes (Signed)
Subjective:   Allison Williamson is a 20 y.o. G2P0010 at 42w3dby LMP being seen today for her first obstetrical visit.  Her obstetrical history is significant for no problems as this is her first pregnancy - planned. Patient does intend to breast feed. Pregnancy history fully reviewed.  Patient reports worse allergies this season.  HISTORY: OB History  Gravida Para Term Preterm AB Living  2 0 0 0 1 0  SAB IAB Ectopic Multiple Live Births  1 0 0 0 0    # Outcome Date GA Lbr Len/2nd Weight Sex Delivery Anes PTL Lv  2 Current           1 SAB 10/15/18 158w1d  SAB   ND   History reviewed. No pertinent past medical history. History reviewed. No pertinent surgical history. Family History  Problem Relation Age of Onset  . Diabetes Mother   . Hypertension Mother    Social History   Tobacco Use  . Smoking status: Never Smoker  . Smokeless tobacco: Never Used  Vaping Use  . Vaping Use: Never used  Substance Use Topics  . Alcohol use: Never  . Drug use: Never   No Known Allergies Current Outpatient Medications on File Prior to Visit  Medication Sig Dispense Refill  . Prenatal MV & Min w/FA-DHA (PRENATAL GUMMIES) 0.18-25 MG CHEW Chew by mouth.    . Blood Pressure Monitoring (BLOOD PRESSURE KIT) DEVI 1 kit by Does not apply route once a week. 1 each 0   No current facility-administered medications on file prior to visit.     Exam   Vitals:   01/16/21 1307  BP: 120/76  Pulse: 90  Weight: 130 lb (59 kg)   Fetal Heart Rate (bpm): 160  Uterus:     Pelvic Exam: Perineum: no hemorrhoids, normal perineum   Vulva: normal external genitalia, no lesions   Vagina:  normal mucosa, normal discharge   Cervix: no lesions and normal, pap smear done.    Adnexa: normal adnexa and no mass, fullness, tenderness   Bony Pelvis: average  System: General: well-developed, well-nourished female in no acute distress   Breast:  normal appearance, no masses or tenderness   Skin:  normal coloration and turgor, no rashes   Neurologic: oriented, normal, negative, normal mood   Extremities: normal strength, tone, and muscle mass, ROM of all joints is normal   HEENT extraocular movement intact and sclera clear, anicteric   Mouth/Teeth deferred   Neck supple and no masses, normal thyroid   Cardiovascular: regular rate and rhythm   Respiratory:  no respiratory distress, normal breath sounds   Abdomen: soft, non-tender; no masses,  no organomegaly     Assessment:   Pregnancy: G2P0010 Patient Active Problem List   Diagnosis Date Noted  . Encounter for supervision of normal pregnancy in first trimester 01/09/2021     Plan:  1. Encounter for supervision of other normal pregnancy in first trimester Last visit to dentist was over one year ago.  Advised to see now for a cleaning appointment and reviewed dental health in pregnancy.   This was her first pap smear and was dreading the pelvic and the blood draw today.  Tolerated both well - just a surprise to her that these were part of pregnancy care. Nausea has resolved - never did have vomiting.  - Cytology - PAP( [place]) - Cervicovaginal ancillary only( [place]) - Obstetric Panel, Including HIV - Hepatitis C antibody -  Culture, OB Urine - Genetic Screening - US MFM OB COMP + 14 WK; Future  2. Allergy, initial encounter Worse in pregnancy.   Prescribed Zyrtec that she has had in the past Given list of meds considered safe in pregnancy  Initial labs drawn. Continue prenatal vitamins. Genetic Screening discussed, NIPS: ordered. Ultrasound discussed; fetal anatomic survey: ordered. Problem list reviewed and updated. The nature of [place] with multiple MDs and other Advanced Practice Providers was explained to patient; also emphasized that residents, students are part of our team. Routine obstetric precautions reviewed. Return in about 4 weeks (around 02/13/2021) for  ROB in person.  Total face-to-face time with patient: 40 minutes.  Over 50% of encounter was spent on counseling and coordination of care.     Earlie Server, FNP Family Nurse Practitioner, [facility] for [employer], [place] [facility] 01/16/2021 2:43 PM

## 2021-01-16 NOTE — Progress Notes (Signed)
Pt would like Rx sent for allergies.

## 2021-01-16 NOTE — Patient Instructions (Signed)
Safe Medications in Pregnancy  ° °Acne: °Benzoyl Peroxide °Salicylic Acid ° °Backache/Headache: °Tylenol: 2 regular strength every 4 hours OR °             2 Extra strength every 6 hours ° °Colds/Coughs/Allergies: °Benadryl (alcohol free) 25 mg every 6 hours as needed °Breath right strips °Claritin °Cepacol throat lozenges °Chloraseptic throat spray °Cold-Eeze- up to three times per day °Cough drops, alcohol free °Flonase  °Guaifenesin °Mucinex °Robitussin DM (plain only, alcohol free) °Saline nasal spray/drops °Sudafed (pseudoephedrine) & Actifed ** use only after [redacted] weeks gestation and if you do not have high blood pressure °Tylenol °Vicks Vaporub °Zinc lozenges °Zyrtec  ° °Constipation: °Colace °Ducolax suppositories °Fleet enema °Glycerin suppositories °Metamucil °Milk of magnesia °Miralax °Senokot °Smooth move tea ° °Diarrhea: °Kaopectate °Imodium A-D ° °*NO pepto Bismol ° °Hemorrhoids: °Anusol °Anusol HC °Preparation H °Tucks ° °Indigestion: °Tums °Maalox °Mylanta °Zantac  °Pepcid ° °Insomnia: °Benadryl (alcohol free) 25mg every 6 hours as needed °Tylenol PM °Unisom, no Gelcaps ° °Leg Cramps: °Tums °MagGel ° °Nausea/Vomiting:  °Bonine °Dramamine °Emetrol °Ginger extract °Sea bands °Meclizine  °Nausea medication to take during pregnancy:  °Unisom (doxylamine succinate 25 mg tablets) Take one tablet daily at bedtime. If symptoms are not adequately controlled, the dose can be increased to a maximum recommended dose of two tablets daily (1/2 tablet in the morning, 1/2 tablet mid-afternoon and one at bedtime). °Vitamin B6 100mg tablets. Take one tablet twice a day (up to 200 mg per day). ° °Skin Rashes: °Aveeno products °Benadryl cream or 25mg every 6 hours as needed °Calamine Lotion °1% cortisone cream ° °Yeast infection: °Gyne-lotrimin 7 °Monistat 7 ° ° °**If taking multiple medications, please check labels to avoid duplicating the same active ingredients °**take medication as directed on the label °** Do not  exceed 4000 mg of tylenol in 24 hours °**Do not take medications that contain aspirin or ibuprofen ° °  °

## 2021-01-17 LAB — OBSTETRIC PANEL, INCLUDING HIV
Antibody Screen: NEGATIVE
Basophils Absolute: 0 10*3/uL (ref 0.0–0.2)
Basos: 1 %
EOS (ABSOLUTE): 0.4 10*3/uL (ref 0.0–0.4)
Eos: 7 %
HIV Screen 4th Generation wRfx: NONREACTIVE
Hematocrit: 34.6 % (ref 34.0–46.6)
Hemoglobin: 11.7 g/dL (ref 11.1–15.9)
Hepatitis B Surface Ag: NEGATIVE
Immature Grans (Abs): 0 10*3/uL (ref 0.0–0.1)
Immature Granulocytes: 1 %
Lymphocytes Absolute: 1.3 10*3/uL (ref 0.7–3.1)
Lymphs: 22 %
MCH: 31.2 pg (ref 26.6–33.0)
MCHC: 33.8 g/dL (ref 31.5–35.7)
MCV: 92 fL (ref 79–97)
Monocytes Absolute: 0.9 10*3/uL (ref 0.1–0.9)
Monocytes: 15 %
Neutrophils Absolute: 3.3 10*3/uL (ref 1.4–7.0)
Neutrophils: 54 %
Platelets: 281 10*3/uL (ref 150–450)
RBC: 3.75 x10E6/uL — ABNORMAL LOW (ref 3.77–5.28)
RDW: 12.9 % (ref 11.7–15.4)
RPR Ser Ql: NONREACTIVE
Rh Factor: POSITIVE
Rubella Antibodies, IGG: 1.21 index (ref >0.99)
WBC: 6 10*3/uL (ref 3.4–10.8)

## 2021-01-17 LAB — CERVICOVAGINAL ANCILLARY ONLY
Chlamydia: NEGATIVE
Comment: NEGATIVE
Comment: NORMAL
Neisseria Gonorrhea: NEGATIVE

## 2021-01-17 LAB — CYTOLOGY - PAP: Diagnosis: NEGATIVE

## 2021-01-17 LAB — HEPATITIS C ANTIBODY: Hep C Virus Ab: <0.1 s/co ratio (ref 0.0–0.9)

## 2021-01-18 LAB — CULTURE, OB URINE

## 2021-01-18 LAB — URINE CULTURE, OB REFLEX

## 2021-01-22 ENCOUNTER — Encounter: Payer: Self-pay | Admitting: Nurse Practitioner

## 2021-01-27 ENCOUNTER — Emergency Department (HOSPITAL_BASED_OUTPATIENT_CLINIC_OR_DEPARTMENT_OTHER)
Admission: EM | Admit: 2021-01-27 | Discharge: 2021-01-27 | Disposition: A | Discharge status: Home / Self Care | Payer: Federal, State, Local not specified - PPO | Attending: Emergency Medicine | Admitting: Emergency Medicine

## 2021-01-27 ENCOUNTER — Encounter (HOSPITAL_BASED_OUTPATIENT_CLINIC_OR_DEPARTMENT_OTHER): Payer: Self-pay

## 2021-01-27 ENCOUNTER — Telehealth: Payer: Self-pay

## 2021-01-27 ENCOUNTER — Other Ambulatory Visit: Payer: Self-pay

## 2021-01-27 DIAGNOSIS — Z3A13 13 weeks gestation of pregnancy: Secondary | ICD-10-CM | POA: Diagnosis not present

## 2021-01-27 DIAGNOSIS — R519 Headache, unspecified: Secondary | ICD-10-CM | POA: Diagnosis not present

## 2021-01-27 DIAGNOSIS — G43909 Migraine, unspecified, not intractable, without status migrainosus: Secondary | ICD-10-CM | POA: Diagnosis not present

## 2021-01-27 DIAGNOSIS — O99351 Diseases of the nervous system complicating pregnancy, first trimester: Secondary | ICD-10-CM | POA: Diagnosis not present

## 2021-01-27 MED ORDER — LACTATED RINGERS IV BOLUS
1000.0000 mL | Freq: Once | INTRAVENOUS | Status: AC
  Administered 2021-01-27: 1000 mL via INTRAVENOUS

## 2021-01-27 MED ORDER — METOCLOPRAMIDE HCL 5 MG/ML IJ SOLN
10.0000 mg | Freq: Once | INTRAMUSCULAR | Status: AC
  Administered 2021-01-27: 10 mg via INTRAVENOUS
  Filled 2021-01-27: qty 2

## 2021-01-27 MED ORDER — ACETAMINOPHEN 325 MG PO TABS
650.0000 mg | ORAL_TABLET | Freq: Once | ORAL | Status: AC
  Administered 2021-01-27: 650 mg via ORAL
  Filled 2021-01-27: qty 2

## 2021-01-27 NOTE — ED Provider Notes (Signed)
[facility] Provider Note   CSN: [id number] Arrival date & time: 01/27/21  1826     History Chief Complaint  Patient presents with  . Headache    Allison Williamson is a 20 y.o. female.  The history is provided by the patient.  Headache Pain location:  L temporal and L parietal Quality:  Sharp Radiates to:  Does not radiate Severity currently:  7/10 Onset quality:  Gradual Duration:  1 day Timing:  Constant Progression:  Worsening Chronicity:  New Similar to prior headaches: no   Context: activity, bright light and loud noise   Relieved by:  None tried Worsened by:  Light Ineffective treatments:  None tried Associated symptoms: nausea and photophobia   Associated symptoms: no abdominal pain, no blurred vision, no congestion, no cough, no diarrhea, no facial pain, no hearing loss, no loss of balance, no neck pain, no neck stiffness, no numbness, no paresthesias, no visual change and no vomiting   Risk factors comment:  Currently [redacted] weeks pregnant with an uncomplicated pregnancy thus far      History reviewed. No pertinent past medical history.  Patient Active Problem List   Diagnosis Date Noted  . Encounter for supervision of normal pregnancy in first trimester 01/09/2021    No past surgical history on file.   OB History    Gravida  2   Para      Term      Preterm      AB  1   Living        SAB  1   IAB      Ectopic      Multiple      Live Births              Family History  Problem Relation Age of Onset  . Diabetes Mother   . Hypertension Mother     Social History   Tobacco Use  . Smoking status: Never Smoker  . Smokeless tobacco: Never Used  Vaping Use  . Vaping Use: Never used  Substance Use Topics  . Alcohol use: Never  . Drug use: Never    Home Medications Prior to Admission medications   Medication Sig Start Date End Date Taking? Authorizing Provider  Blood Pressure Monitoring (BLOOD  PRESSURE KIT) DEVI 1 kit by Does not apply route once a week. 01/09/21   Constant, Peggy, MD  Cetirizine HCl (ZYRTEC ALLERGY) 10 MG CAPS Take 1 capsule (10 mg total) by mouth daily. 01/16/21   Burleson, Rona Ravens, NP  Prenatal MV & Min w/FA-DHA (PRENATAL GUMMIES) 0.18-25 MG CHEW Chew by mouth.    [provider]    Allergies    Patient has no known allergies.  Review of Systems   Review of Systems  HENT: Negative for congestion and hearing loss.   Eyes: Positive for photophobia. Negative for blurred vision.  Respiratory: Negative for cough.   Gastrointestinal: Positive for nausea. Negative for abdominal pain, diarrhea and vomiting.  Musculoskeletal: Negative for neck pain and neck stiffness.  Neurological: Positive for headaches. Negative for numbness, paresthesias and loss of balance.  All other systems reviewed and are negative.   Physical Exam Updated Vital Signs BP 107/70 (BP Location: Right Arm)   Pulse 85   Temp 99.3 F (37.4 C) (Oral)   Resp 20   Ht 5' 1"  (1.549 m)   Wt 59 kg   LMP 10/28/2020   SpO2 100%   BMI 24.56 kg/m  Physical Exam Vitals and nursing note reviewed.  Constitutional:      General: She is not in acute distress.    Appearance: She is well-developed.  HENT:     Head: Normocephalic and atraumatic.     Mouth/Throat:     Mouth: Mucous membranes are moist.  Eyes:     Extraocular Movements: Extraocular movements intact.     Pupils: Pupils are equal, round, and reactive to light.     Funduscopic exam:    Right eye: No papilledema.        Left eye: No papilledema.  Cardiovascular:     Rate and Rhythm: Normal rate and regular rhythm.     Heart sounds: Normal heart sounds. No murmur heard. No friction rub.  Pulmonary:     Effort: Pulmonary effort is normal.     Breath sounds: Normal breath sounds. No wheezing or rales.  Abdominal:     General: Bowel sounds are normal. There is no distension.     Palpations: Abdomen is soft.     Tenderness:  There is no abdominal tenderness. There is no guarding or rebound.  Musculoskeletal:        General: No tenderness. Normal range of motion.     Cervical back: Normal range of motion and neck supple. No tenderness.     Comments: No edema  Lymphadenopathy:     Cervical: No cervical adenopathy.  Skin:    General: Skin is warm and dry.     Findings: No rash.  Neurological:     Mental Status: She is alert and oriented to person, place, and time. Mental status is at baseline.     Cranial Nerves: No cranial nerve deficit.     Sensory: No sensory deficit.     Motor: No weakness.     Gait: Gait normal.     Comments: photophobia  Psychiatric:        Mood and Affect: Mood normal.        Behavior: Behavior normal.     ED Results / Procedures / Treatments   Labs (all labs ordered are listed, but only abnormal results are displayed) Labs Reviewed - No data to display  EKG None  Radiology No results found.  Procedures Procedures   Medications Ordered in ED Medications  acetaminophen (TYLENOL) tablet 650 mg (has no administration in time range)  metoCLOPramide (REGLAN) injection 10 mg (has no administration in time range)  lactated ringers bolus 1,000 mL (has no administration in time range)    ED Course  I have reviewed the triage vital signs and the nursing notes.  Pertinent labs & imaging results that were available during my care of the patient were reviewed by me and considered in my medical decision making (see chart for details).    MDM Rules/Calculators/A&P                          Pt with hx most suggestive of migraine HA without sx suggestive of SAH(sudden onset, worst of life, or deficits), infection, or cavernous vein thrombosis. Pt is currently [redacted]weeks pregnant with uncomplicated pregnancy.  She is not having any visual changes, neck pain, back pain, abdominal pain, vaginal bleeding.  She has followed up with OB and everything has been normal thus far.  Could be  pregnancy that is causing the headaches.  Normal neuro exam and vital signs. Will give HA cocktail and on re-eval.  8:52 PM Patient feeling much better after  medications on reevaluation.  She was discharged home in good condition.  Final Clinical Impression(s) / ED Diagnoses Final diagnoses:  Migraine without status migrainosus, not intractable, unspecified migraine type    Rx / DC Orders ED Discharge Orders    None       Blanchie Dessert, MD 01/27/21 2052

## 2021-01-27 NOTE — Discharge Instructions (Signed)
Make sure you drink plenty of fluids to stay hydrated.  You can take extra strength Tylenol as needed for pain.  If you start having severe headache, visual changes, vomiting and unable to hold anything down please return to the ER.

## 2021-01-27 NOTE — ED Triage Notes (Addendum)
Pt reports HA that started this AM. This afternoon pain became worse and started having "shooting pains." Pt having associated nausea and vomited x 1. Pt denies any photophobia.   Pt is [redacted] weeks pregnant.

## 2021-01-27 NOTE — Telephone Encounter (Signed)
Patient called and left message stating that she is having headaches that in unrelieved. I returned the patients call. No answer. Unable to leave vm due to vm box being full.

## 2021-01-28 ENCOUNTER — Encounter: Payer: Self-pay | Admitting: Nurse Practitioner

## 2021-02-13 ENCOUNTER — Encounter: Payer: Self-pay | Admitting: Obstetrics & Gynecology

## 2021-02-13 ENCOUNTER — Ambulatory Visit (INDEPENDENT_AMBULATORY_CARE_PROVIDER_SITE_OTHER): Payer: Federal, State, Local not specified - PPO | Admitting: Obstetrics & Gynecology

## 2021-02-13 ENCOUNTER — Telehealth: Payer: Self-pay | Admitting: *Deleted

## 2021-02-13 ENCOUNTER — Other Ambulatory Visit: Payer: Self-pay

## 2021-02-13 VITALS — BP 113/69 | HR 98 | Wt 134.0 lb

## 2021-02-13 DIAGNOSIS — Z3481 Encounter for supervision of other normal pregnancy, first trimester: Secondary | ICD-10-CM

## 2021-02-13 NOTE — Progress Notes (Signed)
   PRENATAL VISIT NOTE  Subjective:  Allison Williamson is a 20 y.o. G2P0010 at [redacted]w[redacted]d being seen today for ongoing prenatal care.  She is currently monitored for the following issues for this low-risk pregnancy and has Encounter for supervision of normal pregnancy in first trimester on their problem list.  Patient reports headache and nasal allergy sx.  Contractions: Not present. Vag. Bleeding: None.  Movement: Absent. Denies leaking of fluid.   The following portions of the patient's history were reviewed and updated as appropriate: allergies, current medications, past family history, past medical history, past social history, past surgical history and problem list.   Objective:   Vitals:   02/13/21 1353  BP: 113/69  Pulse: 98  Weight: 134 lb (60.8 kg)    Fetal Status: Fetal Heart Rate (bpm): 145   Movement: Absent     General:  Alert, oriented and cooperative. Patient is in no acute distress.  Skin: Skin is warm and dry. No rash noted.   Cardiovascular: Normal heart rate noted  Respiratory: Normal respiratory effort, no problems with respiration noted  Abdomen: Soft, gravid, appropriate for gestational age.  Pain/Pressure: Absent     Pelvic: Cervical exam deferred        Extremities: Normal range of motion.  Edema: None  Mental Status: Normal mood and affect. Normal behavior. Normal judgment and thought content.   Assessment and Plan:  Pregnancy: G2P0010 at [redacted]w[redacted]d 1. 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 Please refer to After Visit Summary for other counseling recommendations.   Return in about 5 weeks  (around 03/20/2021).  Future Appointments  Date Time Provider Department Center  03/13/2021 10:15 AM [facility]    Scheryl Darter, MD

## 2021-02-13 NOTE — Telephone Encounter (Signed)
Pt called to office only leaving name and contact number.  Attempt to return call. No answer, no VM.

## 2021-02-13 NOTE — Patient Instructions (Signed)

## 2021-02-13 NOTE — Progress Notes (Signed)
ROB [redacted]w[redacted]d AFP today   SL:[id number] a lot w/ heat outside pt notes to be drinking 5 bottles of water a day. No visual changes, no swelling.   *Pt has not picked up B/P cuff yet. Sent and received 01/09/21

## 2021-02-15 LAB — AFP, SERUM, OPEN SPINA BIFIDA
AFP MoM: 0.82
AFP Value: 29.4 ng/mL
Gest. Age on Collection Date: 15 weeks
Maternal Age At EDD: 22 yr
OSBR Risk 1 IN: 10000
Test Results:: NEGATIVE
Weight: 134 [lb_av]

## 2021-03-13 ENCOUNTER — Other Ambulatory Visit: Payer: Self-pay | Admitting: *Deleted

## 2021-03-13 ENCOUNTER — Other Ambulatory Visit: Payer: Self-pay | Admitting: Nurse Practitioner

## 2021-03-13 ENCOUNTER — Ambulatory Visit: Payer: BC Managed Care – PPO | Attending: Nurse Practitioner

## 2021-03-13 ENCOUNTER — Other Ambulatory Visit: Payer: Self-pay

## 2021-03-13 DIAGNOSIS — Z3481 Encounter for supervision of other normal pregnancy, first trimester: Secondary | ICD-10-CM

## 2021-03-13 DIAGNOSIS — Z362 Encounter for other antenatal screening follow-up: Secondary | ICD-10-CM

## 2021-03-14 DIAGNOSIS — F4323 Adjustment disorder with mixed anxiety and depressed mood: Secondary | ICD-10-CM | POA: Diagnosis not present

## 2021-03-20 ENCOUNTER — Encounter: Payer: Federal, State, Local not specified - PPO | Admitting: Advanced Practice Midwife

## 2021-03-26 ENCOUNTER — Encounter: Payer: Self-pay | Admitting: Family Medicine

## 2021-03-26 ENCOUNTER — Other Ambulatory Visit: Payer: Self-pay

## 2021-03-26 ENCOUNTER — Ambulatory Visit (INDEPENDENT_AMBULATORY_CARE_PROVIDER_SITE_OTHER): Payer: Federal, State, Local not specified - PPO | Admitting: Family Medicine

## 2021-03-26 VITALS — BP 110/70 | HR 84 | Wt 139.0 lb

## 2021-03-26 DIAGNOSIS — Z3481 Encounter for supervision of other normal pregnancy, first trimester: Secondary | ICD-10-CM

## 2021-03-26 NOTE — Progress Notes (Signed)
+   Fetal movement. No complaints.  

## 2021-03-26 NOTE — Patient Instructions (Signed)

## 2021-03-26 NOTE — Progress Notes (Signed)
   Subjective:  Allison Williamson is a 20 y.o. G2P0010 at [redacted]w[redacted]d being seen today for ongoing prenatal care.  She is currently monitored for the following issues for this low-risk pregnancy and has Encounter for supervision of normal pregnancy in first trimester on their problem list.  Patient reports no complaints.  Contractions: Not present. Vag. Bleeding: None.  Movement: Present. Denies leaking of fluid.   The following portions of the patient's history were reviewed and updated as appropriate: allergies, current medications, past family history, past medical history, past social history, past surgical history and problem list. Problem list updated.  Objective:   Vitals:   03/26/21 1445  BP: 110/70  Pulse: 84  Weight: 139 lb (63 kg)    Fetal Status: Fetal Heart Rate (bpm): 147   Movement: Present     General:  Alert, oriented and cooperative. Patient is in no acute distress.  Skin: Skin is warm and dry. No rash noted.   Cardiovascular: Normal heart rate noted  Respiratory: Normal respiratory effort, no problems with respiration noted  Abdomen: Soft, gravid, appropriate for gestational age. Pain/Pressure: Absent     Pelvic: Vag. Bleeding: None     Cervical exam deferred        Extremities: Normal range of motion.  Edema: None  Mental Status: Normal mood and affect. Normal behavior. Normal judgment and thought content.   Urinalysis:      Assessment and Plan:  Pregnancy: G2P0010 at [redacted]w[redacted]d  1. Encounter for supervision of other normal pregnancy in first trimester BP and FHR normal Anatomy scan with EIF, otherwise unremarkable Many questions answered regarding L&D, need for induction, pain management, etc.  Preterm labor symptoms and general obstetric precautions including but not limited to vaginal bleeding, contractions, leaking of fluid and fetal movement were reviewed in detail with the patient. Please refer to After Visit Summary for other counseling recommendations.   Return in 4 weeks (on 04/23/2021) for [facility], ob visit.   Venora Maples, MD

## 2021-04-10 ENCOUNTER — Encounter: Payer: Self-pay | Admitting: *Deleted

## 2021-04-10 ENCOUNTER — Ambulatory Visit: Payer: Federal, State, Local not specified - PPO | Attending: Obstetrics and Gynecology

## 2021-04-10 ENCOUNTER — Ambulatory Visit: Payer: Federal, State, Local not specified - PPO | Admitting: *Deleted

## 2021-04-10 ENCOUNTER — Other Ambulatory Visit: Payer: Self-pay

## 2021-04-10 VITALS — BP 110/62 | HR 87

## 2021-04-10 DIAGNOSIS — Z3481 Encounter for supervision of other normal pregnancy, first trimester: Secondary | ICD-10-CM

## 2021-04-10 DIAGNOSIS — Z362 Encounter for other antenatal screening follow-up: Secondary | ICD-10-CM | POA: Diagnosis not present

## 2021-04-23 ENCOUNTER — Other Ambulatory Visit: Payer: Self-pay

## 2021-04-23 ENCOUNTER — Ambulatory Visit (INDEPENDENT_AMBULATORY_CARE_PROVIDER_SITE_OTHER): Payer: Federal, State, Local not specified - PPO | Admitting: Women's Health

## 2021-04-23 VITALS — BP 117/69 | HR 88 | Wt 140.6 lb

## 2021-04-23 DIAGNOSIS — Z3481 Encounter for supervision of other normal pregnancy, first trimester: Secondary | ICD-10-CM

## 2021-04-23 DIAGNOSIS — Z148 Genetic carrier of other disease: Secondary | ICD-10-CM

## 2021-04-23 DIAGNOSIS — Z3A25 25 weeks gestation of pregnancy: Secondary | ICD-10-CM

## 2021-04-23 NOTE — Progress Notes (Signed)
ROB 25.2 wks  No complaints

## 2021-04-23 NOTE — Patient Instructions (Signed)
Maternity Assessment Unit (MAU)  The Maternity Assessment Unit (MAU) is located at the [facility] and [place] at [facility]. The address is: [address] [place], [place], [place] [address]. Please see map below for additional directions.    The Maternity Assessment Unit is designed to help you during your pregnancy, and for up to 6 weeks after delivery, with any pregnancy- or postpartum-related emergencies, if you think you are in labor, or if your water has broken. For example, if you experience nausea and vomiting, vaginal bleeding, severe abdominal or pelvic pain, elevated blood pressure or other problems related to your pregnancy or postpartum time, please come to the Maternity Assessment Unit for assistance.       Preterm Labor The normal length of a pregnancy is 39-41 weeks. Preterm labor is when labor starts before 37 completed weeks of pregnancy. Babies who are born prematurely and survive may not be fully developed and may be at an increased risk for long-term problems such as cerebral palsy, developmental delays, and vision andhearing problems. Babies who are born too early may have problems soon after birth. Premature babies may have problems regulating blood sugar, body temperature, heart rate, and breathing rate. These babies often have trouble with feeding. The risk ofhaving problems is highest for babies who are born before 39 weeks of pregnancy. What are the causes? The exact cause of this condition is not known. What increases the risk? You are more likely to have preterm labor if you have certain risk factors that relate to your medical history, problems with present and past pregnancies, andlifestyle factors. Medical history You have abnormalities of the uterus, including a short cervix. You have STIs (sexually transmitted infections) or other infections of the urinary tract and the vagina. You have chronic illnesses, such as blood clotting  problems, diabetes, or high blood pressure. You are overweight or underweight. Present and past pregnancies You have had preterm labor before. You are pregnant with twins or other multiples. You have been diagnosed with a condition in which the placenta covers your cervix (placenta previa). You waited less than 18 months between giving birth and becoming pregnant again. Your unborn baby has some abnormalities. You have vaginal bleeding during pregnancy. You became pregnant through in vitro fertilization (IVF). Lifestyle and environmental factors You use tobacco products or drink alcohol. You use drugs. You have stress and no social support. You experience domestic violence. You are exposed to certain chemicals or environmental pollutants. Other factors You are younger than age 94 or older than age 43. What are the signs or symptoms? Symptoms of this condition include: Cramps similar to those that can happen during a menstrual period. The cramps may happen with diarrhea. Pain in the abdomen or lower back. Regular contractions that may feel like tightening of the abdomen. A feeling of increased pressure in the pelvis. Increased watery or bloody mucus discharge from the vagina. Water breaking (ruptured amniotic sac). How is this diagnosed? This condition is diagnosed based on: Your medical history and a physical exam. A pelvic exam. An ultrasound. Monitoring your uterus for contractions. Other tests, including: A swab of the cervix to check for a chemical called fetal fibronectin. Urine tests. How is this treated? Treatment for this condition depends on the length of your pregnancy, your condition, and the health of your baby. Treatment may include: Taking medicines, such as: Hormone medicines. These may be given early in pregnancy to help support the pregnancy. Medicines to stop contractions. Medicines to  help mature the baby's lungs. These may be prescribed if the risk of  delivery is high. Medicines to help protect your baby from brain and nerve complications such as cerebral palsy. Bed rest. If the labor happens before 34 weeks of pregnancy, you may need to stay in the hospital. Delivery of the baby. Follow these instructions at home:  Do not use any products that contain nicotine or tobacco. These products include cigarettes, chewing tobacco, and vaping devices, such as e-cigarettes. If you need help quitting, ask your health care provider. Do not drink alcohol. Take over-the-counter and prescription medicines only as told by your health care provider. Rest as told by your health care provider. Return to your normal activities as told by your health care provider. Ask your health care provider what activities are safe for you. Keep all follow-up visits. This is important. How is this prevented? To increase your chance of having a full-term pregnancy: Do not use drugs or take medicines that have not been prescribed to you during your pregnancy. Talk with your health care provider before taking any herbal supplements, even if you have been taking them regularly. Make sure you gain a healthy amount of weight during your pregnancy. Watch for infection. If you think that you might have an infection, get it checked right away. Symptoms of infection may include: Fever. Abnormal vaginal discharge or discharge that smells bad. Pain or burning with urination. Needing to urinate urgently. Frequently urinating or passing small amounts of urine frequently. Blood in your urine or urine that smells bad or unusual. Where to find more information U.S. Department of Health and Programmer, systems on Women's Health: [place]Signs.tn [employer] of Obstetricians and Gynecologists: www.acog.org Centers for Disease Control and Prevention, Preterm Birth: http://www.wolf.info/ Contact a health care provider if: You think you are going into preterm labor. You have signs  or symptoms of preterm labor. You have symptoms of infection. Get help right away if: You are having regular, painful contractions every 5 minutes or less. Your water breaks. Summary Preterm labor is labor that starts before you reach 37 weeks of pregnancy. Delivering your baby early increases your baby's risk of developing long-term problems. You are more likely to have preterm labor if you have certain risk factors that relate to your medical history, problems with present and past pregnancies, and lifestyle factors. Keep all follow-up visits. This is important. Contact a health care provider if you have signs or symptoms of preterm labor. This information is not intended to replace advice given to you by your health care provider. Make sure you discuss any questions you have with your healthcare provider. Document Revised: 09/03/2020 Document Reviewed: 09/03/2020 Elsevier Patient Education  [place].       Oral Glucose Tolerance Test During Pregnancy Why am I having this test? The oral glucose tolerance test (OGTT) is done to check how your body processes blood sugar (glucose). This is one of several tests used to diagnose diabetes that develops during pregnancy (gestational diabetes mellitus). Gestational diabetes is a short-term form of diabetes that some women develop while they are pregnant. It usually occurs during the second trimesterof pregnancy and goes away after delivery. Testing, or screening, for gestational diabetes usually occurs at weeks 24-28 of pregnancy. You may have the OGTT test after having a 1-hour glucose screening test if the results from that test indicate that you may have gestational diabetes. This test may also be needed if: You have a history of gestational  diabetes. There is a history of giving birth to very large babies or of losing pregnancies (having stillbirths). You have signs and symptoms of diabetes, such as: Changes in your  eyesight. Tingling or numbness in your hands or feet. Changes in hunger, thirst, and urination, and these are not explained by your pregnancy. What is being tested? This test measures the amount of glucose in your blood at different timesduring a period of 3 hours. This shows how well your body can process glucose. What kind of sample is taken?  Blood samples are required for this test. They are usually collected byinserting a needle into a blood vessel. How do I prepare for this test? For 3 days before your test, eat normally. Have plenty of carbohydrate-rich foods. Follow instructions from your health care provider about: Eating or drinking restrictions on the day of the test. You may be asked not to eat or drink anything other than water (to fast) starting 8-10 hours before the test. Changing or stopping your regular medicines. Some medicines may interfere with this test. Tell a health care provider about: All medicines you are taking, including vitamins, herbs, eye drops, creams, and over-the-counter medicines. Any blood disorders you have. Any surgeries you have had. Any medical conditions you have. What happens during the test? First, your blood glucose will be measured. This is referred to as your fasting blood glucose because you fasted before the test. Then, you will drink a glucose solution that contains a certain amount of glucose. Your blood glucosewill be measured again 1, 2, and 3 hours after you drink the solution. This test takes about 3 hours to complete. You will need to stay at the testing location during this time. During the testing period: Do not eat or drink anything other than the glucose solution. Do not exercise. Do not use any products that contain nicotine or tobacco, such as cigarettes, e-cigarettes, and chewing tobacco. These can affect your test results. If you need help quitting, ask your health care provider. The testing procedure may vary among health care  providers and hospitals. How are the results reported? Your results will be reported as milligrams of glucose per deciliter of blood (mg/dL) or millimoles per liter (mmol/L). There is more than one source for screening and diagnosis reference values used to diagnose gestational diabetes. Your health care provider will compare your results to normal values that were established after testing a large group of people (reference values). Reference values may vary among labs and hospitals. For this test (Carpenter-Coustan), reference values are: Fasting: 95 mg/dL (5.3 mmol/L). 1 hour: 180 mg/dL (10.0 mmol/L). 2 hour: 155 mg/dL (8.6 mmol/L). 3 hour: 140 mg/dL (7.8 mmol/L). What do the results mean? Results below the reference values are considered normal. If two or more of your blood glucose levels are at or above the reference values, you may be diagnosed with gestational diabetes. If only one level is high, your healthcare provider may suggest repeat testing or other tests to confirm a diagnosis. Talk with your health care provider about what your results mean. Questions to ask your health care provider Ask your health care provider, or the department that is doing the test: When will my results be ready? How will I get my results? What are my treatment options? What other tests do I need? What are my next steps? Summary The oral glucose tolerance test (OGTT) is one of several tests used to diagnose diabetes that develops during pregnancy (gestational diabetes mellitus). Gestational diabetes is   a short-term form of diabetes that some women develop while they are pregnant. You may have the OGTT test after having a 1-hour glucose screening test if the results from that test show that you may have gestational diabetes. You may also have this test if you have any symptoms or risk factors for this type of diabetes. Talk with your health care provider about what your results mean. This information is not  intended to replace advice given to you by your health care provider. Make sure you discuss any questions you have with your healthcare provider. Document Revised: 02/08/2020 Document Reviewed: 02/08/2020 Elsevier Patient Education  2022 Elsevier Inc.       AREA PEDIATRIC/FAMILY PRACTICE PHYSICIANS  [facility] [address] [place], [place] [address] Phone - [phone]   Fax - [phone]  JACK AMOS [address] [place], [place]  [address] Phone - [phone]   Fax - [phone]  [facility] [address] [place], [place]  [address] Phone - [phone]   Fax - [phone]  [facility] [address] [place], [place]  [address] Phone - [phone]   Fax - [phone]  [facility] [address] [place], [place]  [address] Phone - [phone]   Fax - [phone]  [facility] [address] [place], [place]  [address] Phone - [phone]   Fax - [phone]  [facility]  [address] [place], [place]  [address] Phone - [phone]   Fax - [phone]  [facility] AT [facility] [address] [place], [address] [place], [place]  [address] Phone - [phone]   Fax - [phone]  [facility] AT [facility] [address] [place], [place]  [address] Phone - [phone]   Fax - [phone] [facility] AT [place] [address] [place], [place]  [address] Phone - [phone]   Fax - [phone]  [facility] [address] [place], [place]  [address] Phone - [phone]   Fax - [phone]  [facility] [address], [address] [place], [place]  [address] Phone - [phone]   Fax - [phone]  [facility] [place] [address] [place], [place]  [address] Phone - [phone]   Fax  - [phone]  [facility] [address], [address] [place], [place]  [address] Phone - [phone]  [facility] [address] [place], [place]  [address] Phone - [phone]   Fax - [phone]  [facility] [address] [place], [place]  [address] Phone - [phone]   Fax - [phone]  [facility] [address] [place], [place]  [address] Phone - [phone]   Fax - [phone]  [facility] [address] [place], [place]  [address] Phone - [phone]   Fax - [phone]   [facility] [address] [place], [place]  [address] Phone - [phone]   Fax - [phone]  [facility] [address] [place], [place]  [address] Phone - [phone]   Fax - [phone]  DAVID RUBIN [address] [place], [place]  [address] Phone - [phone]   Fax - [phone]  [facility] [address] [place], [place]  [address] Phone - [phone]   Fax - [phone]  Lacey Jensen  [address] [place], [place]  [address] Phone - [phone]   Fax - [phone] Gerarda Fraction [phone] [address] [place], [place]  [address] Phone - [phone]   Fax - [phone]  [facility] [address] [place], [place]  [address] Phone - [phone]   Fax - [phone]  [facility] [place] [address] [place], [place]  [address] Phone - [phone]   Fax - [phone]        Childbirth Education Options: [facility] Department Classes:  Childbirth education classes can help you get ready for a positive parenting experience. You can also meet other expectant parents and get free stuff for your baby. Each class runs for five weeks on the same night and costs $45 for the mother-to-be and her support person. Medicaid covers  the cost if you are eligible. Call [phone] to register. [facility] Childbirth Education:  Register at [email or web address]   There are fees associated with some of these classes. Check website for most up-to-date information.   Baby & Me Class: Discuss newborn & infant parenting and family adjustment issues with other new mothers in a relaxed environment. Each week brings a new speaker or baby-centered activity. We encourage new mothers to join [place] every Thursday at 11:00am. Babies birth until crawling. No registration or fee. Daddy [employer]: This course offers Dads-to-be the tools and knowledge needed to feel confident on their journey to becoming new fathers. Experienced dads, who have been trained as coaches, teach dads-to-be how to hold, comfort, diaper, swaddle and play with their infant while being able to support the new mom as well. A class for men taught by men.  Big Brother/Big Sister: Let your children share in the joy of a new brother or sister in this special class designed just for them. Class includes discussion about how families care for babies: swaddling, holding, diapering, safety as well as how they can be helpful in their new role. This class is designed for children ages 2 to 81, but any age is welcome. Please register each child individually.  Mom Talk: This mom-led group offers support and connection to mothers as they journey through the adjustments and struggles of that sometimes overwhelming first year after the birth of a child. Tuesdays at 10:00am and Thursdays at 6:00pm. Babies welcome. No registration or fee. Breastfeeding Support Group: This group is a mother-to-mother support circle where moms have the opportunity to share their breastfeeding experiences. A Lactation Consultant is present for questions and concerns. Meets each Tuesday at 11:00am. No fee or registration. Breastfeeding Your Baby: Learn what to expect in the first days of breastfeeding your  newborn.  This class will help you feel more confident with the skills needed to begin your breastfeeding experience. Many new mothers are concerned about breastfeeding after leaving the hospital. This class will also address the most common fears and challenges about breastfeeding during the first few weeks, months and beyond. (call for fee) Comfort Techniques and Tour: This 2 hour interactive class will provide you the opportunity to learn & practice hands-on techniques that can help relieve some of the discomfort of labor and encourage your baby to rotate toward the best position for birth. You and your partner will be able to try a variety of labor positions with birth balls and rebozos as well as practice breathing, relaxation, and visualization techniques. A tour of the [facility] is included with this class.  Childbirth Class- Weekend Option: This class is a Weekend version of our Birth & Baby series. It is designed for parents who have a difficult time fitting several weeks of classes into their schedule. It covers the care of your newborn and the basics of labor and childbirth. It also includes a Maternity Care Center Tour of [facility] and lunch. The class is held two consecutive days: beginning on Friday evening from 6:30 - 8:30 p.m. and the next day, Saturday from 9 a.m. - 4 p.m. (call for fee) Linden DolinWaterbirth Class: Interested in a waterbirth?  This informational class will help you discover whether waterbirth is the right fit for you. Education about waterbirth itself, supplies you would need and how to assemble your support team is what you can expect from this class. Some obstetrical practices require this class in order to pursue a waterbirth. (Not all obstetrical practices offer waterbirth-check with your healthcare provider.) Register only the expectant mom, but you are encouraged to bring your partner to class! Required if planning waterbirth, no fee. Infant/Child  CPR: Parents, grandparents, babysitters, and friends learn Cardio-Pulmonary Resuscitation skills for infants and children. You will also learn how to treat both conscious and unconscious choking in infants and children. This Family & Friends program does not offer certification. Register each participant individually to ensure that enough mannequins are available. (Call for fee) Grandparent Love: Expecting a grandbaby? This class is for you! Learn about the latest infant care and safety recommendations and ways to support your own child as he or she transitions into the parenting role. Taught by Registered Nurses who are childbirth instructors, but most importantly...they are grandmothers too! Childbirth Class- Natural Childbirth: This series of 5 weekly classes is for expectant parents who want to learn and practice natural methods of coping with the process of labor and childbirth. Relaxation, breathing, massage, visualization, role of the partner, and helpful positioning are highlighted. Participants learn how to be confident in their body's ability to give birth. This class will empower and help parents make informed decisions about their own care. Includes discussion that will help new parents transition into the immediate postpartum period. Maternity Care Center Tour of [facility] is included. We suggest taking this class between 25-32 weeks, but it's only a recommendation. Childbirth Class- 3 week Series: This option of 3 weekly classes helps you and your labor partner prepare for childbirth. Newborn care, labor & birth, cesarean birth, pain management, and comfort techniques are discussed and a Maternity Care Center Tour of [facility] is included. The class meets at the same time, on the same day of the week for 3 consecutive weeks beginning with the starting date you choose.  Marvelous Multiples: Expecting twins, triplets, or more? This class covers the differences in labor, birth,  parenting, and breastfeeding issues that face multiples' parents. NICU tour is included. Led by a Certified Childbirth Educator who is the mother of twins. No fee. Caring for Baby: This class is for expectant and adoptive parents who want to learn and practice the most up-to-date newborn care for their babies. Focus is on birth through the first six weeks of life. Topics include feeding, bathing, diapering, crying, umbilical cord care, circumcision care and safe sleep. Parents learn to recognize symptoms of illness and when to call the pediatrician. Register only the mom-to-be and your partner or support person can plan to come with you!  Childbirth Class- online option: This online class offers you the freedom to complete a Birth and Baby series  in the comfort of your own home. The flexibility of this option allows you to review sections at your own pace, at times convenient to you and your support people. It includes additional video information, animations, quizzes, and extended activities. Get organized with helpful eClass tools, checklists, and trackers. Once you register online for the class, you will receive an email within a few days to accept the invitation and begin the class when the time is right for you. The content will be available to you for 60 days.

## 2021-04-23 NOTE — Progress Notes (Signed)
Subjective:  Allison Williamson is a 20 y.o. G2P0010 at [redacted]w[redacted]d being seen today for ongoing prenatal care.  She is currently monitored for the following issues for this low-risk pregnancy and has Encounter for supervision of normal pregnancy in first trimester and Genetic carrier on their problem list.  Patient reports no complaints.  Contractions: Not present. Vag. Bleeding: None.  Movement: Present. Denies leaking of fluid.   The following portions of the patient's history were reviewed and updated as appropriate: allergies, current medications, past family history, past medical history, past social history, past surgical history and problem list. Problem list updated.  Objective:   Vitals:   04/23/21 1552  BP: 117/69  Pulse: 88  Weight: 140 lb 9.6 oz (63.8 kg)    Fetal Status: Fetal Heart Rate (bpm): 144   Movement: Present     General:  Alert, oriented and cooperative. Patient is in no acute distress.  Skin: Skin is warm and dry. No rash noted.   Cardiovascular: Normal heart rate noted  Respiratory: Normal respiratory effort, no problems with respiration noted  Abdomen: Soft, gravid, appropriate for gestational age. Pain/Pressure: Absent     Pelvic: Vag. Bleeding: None     Cervical exam deferred        Extremities: Normal range of motion.  Edema: None  Mental Status: Normal mood and affect. Normal behavior. Normal judgment and thought content.   Urinalysis:      Assessment and Plan:  Pregnancy: G2P0010 at [redacted]w[redacted]d  1. Encounter for supervision of other normal pregnancy in first trimester -peds list given -CBE info given -GC ordered -GTT/labs next visit  2. [redacted] weeks gestation of pregnancy  Preterm labor symptoms and general obstetric precautions including but not limited to vaginal bleeding, contractions, leaking of fluid and fetal movement were reviewed in detail with the patient. I discussed the assessment and treatment plan with the patient. The patient was provided an  opportunity to ask questions and all were answered. The patient agreed with the plan and demonstrated an understanding of the instructions. The patient was advised to call back or seek an in-person office evaluation/go to MAU at [facility] for any urgent or concerning symptoms. Please refer to After Visit Summary for other counseling recommendations.  Return in about 2 weeks (around 05/07/2021) for in-person LOB/APP OK/GTT/labs.   Saanvi Hakala, Odie Sera, NP

## 2021-04-30 DIAGNOSIS — F4323 Adjustment disorder with mixed anxiety and depressed mood: Secondary | ICD-10-CM | POA: Diagnosis not present

## 2021-05-07 ENCOUNTER — Other Ambulatory Visit: Payer: Self-pay

## 2021-05-07 ENCOUNTER — Ambulatory Visit (INDEPENDENT_AMBULATORY_CARE_PROVIDER_SITE_OTHER): Payer: BC Managed Care – PPO | Admitting: Obstetrics

## 2021-05-07 ENCOUNTER — Other Ambulatory Visit: Payer: Federal, State, Local not specified - PPO

## 2021-05-07 ENCOUNTER — Encounter: Payer: Self-pay | Admitting: Obstetrics

## 2021-05-07 VITALS — BP 112/75 | HR 93 | Wt 142.6 lb

## 2021-05-07 DIAGNOSIS — Z3480 Encounter for supervision of other normal pregnancy, unspecified trimester: Secondary | ICD-10-CM

## 2021-05-07 DIAGNOSIS — Z23 Encounter for immunization: Secondary | ICD-10-CM | POA: Diagnosis not present

## 2021-05-07 DIAGNOSIS — Z3A27 27 weeks gestation of pregnancy: Secondary | ICD-10-CM

## 2021-05-07 DIAGNOSIS — Z3482 Encounter for supervision of other normal pregnancy, second trimester: Secondary | ICD-10-CM

## 2021-05-07 NOTE — Addendum Note (Signed)
Addended by: Harrel Lemon on: 05/07/2021 12:01 PM   Modules accepted: Orders

## 2021-05-07 NOTE — Progress Notes (Signed)
ROB 27.2wks GTT CBC, HIV, RPR Depression and anxiety screen negative TDAP offered and accepted.

## 2021-05-07 NOTE — Progress Notes (Signed)
Subjective:  Allison Williamson is a 20 y.o. G2P0010 at [redacted]w[redacted]d being seen today for ongoing prenatal care.  She is currently monitored for the following issues for this low-risk pregnancy and has Encounter for supervision of normal pregnancy in first trimester and Genetic carrier on their problem list.  Patient reports heartburn.  Contractions: Not present. Vag. Bleeding: None.  Movement: Present. Denies leaking of fluid.   The following portions of the patient's history were reviewed and updated as appropriate: allergies, current medications, past family history, past medical history, past social history, past surgical history and problem list. Problem list updated.  Objective:   Vitals:   05/07/21 0846  BP: 112/75  Pulse: 93  Weight: 142 lb 9.6 oz (64.7 kg)    Fetal Status: Fetal Heart Rate (bpm): 130   Movement: Present     General:  Alert, oriented and cooperative. Patient is in no acute distress.  Skin: Skin is warm and dry. No rash noted.   Cardiovascular: Normal heart rate noted  Respiratory: Normal respiratory effort, no problems with respiration noted  Abdomen: Soft, gravid, appropriate for gestational age. Pain/Pressure: Absent     Pelvic:  Cervical exam deferred        Extremities: Normal range of motion.  Edema: None  Mental Status: Normal mood and affect. Normal behavior. Normal judgment and thought content.   Urinalysis:      Assessment and Plan:  Pregnancy: G2P0010 at [redacted]w[redacted]d  1. Supervision of other normal pregnancy, antepartum   Preterm labor symptoms and general obstetric precautions including but not limited to vaginal bleeding, contractions, leaking of fluid and fetal movement were reviewed in detail with the patient. Please refer to After Visit Summary for other counseling recommendations.   Return in about 2 weeks (around 05/21/2021) for ROB.   Brock Bad, MD  05/07/21

## 2021-05-08 ENCOUNTER — Other Ambulatory Visit: Payer: Self-pay | Admitting: Obstetrics

## 2021-05-08 DIAGNOSIS — O99013 Anemia complicating pregnancy, third trimester: Secondary | ICD-10-CM

## 2021-05-08 LAB — CBC
Hematocrit: 31.3 % — ABNORMAL LOW (ref 34.0–46.6)
Hemoglobin: 10.7 g/dL — ABNORMAL LOW (ref 11.1–15.9)
MCH: 31.6 pg (ref 26.6–33.0)
MCHC: 34.2 g/dL (ref 31.5–35.7)
MCV: 92 fL (ref 79–97)
Platelets: 295 10*3/uL (ref 150–450)
RBC: 3.39 x10E6/uL — ABNORMAL LOW (ref 3.77–5.28)
RDW: 11.8 % (ref 11.7–15.4)
WBC: 7.1 10*3/uL (ref 3.4–10.8)

## 2021-05-08 LAB — HIV ANTIBODY (ROUTINE TESTING W REFLEX): HIV Screen 4th Generation wRfx: NONREACTIVE

## 2021-05-08 LAB — RPR: RPR Ser Ql: NONREACTIVE

## 2021-05-08 LAB — GLUCOSE TOLERANCE, 2 HOURS W/ 1HR
Glucose, 1 hour: 118 mg/dL (ref 65–179)
Glucose, 2 hour: 89 mg/dL (ref 65–152)
Glucose, Fasting: 64 mg/dL — ABNORMAL LOW (ref 65–91)

## 2021-05-08 MED ORDER — FERROUS SULFATE 325 (65 FE) MG PO TABS: 325.0000 mg | ORAL_TABLET | ORAL | 5 refills | Status: DC

## 2021-05-14 DIAGNOSIS — F4323 Adjustment disorder with mixed anxiety and depressed mood: Secondary | ICD-10-CM | POA: Diagnosis not present

## 2021-05-16 ENCOUNTER — Inpatient Hospital Stay (HOSPITAL_COMMUNITY)
Admission: AD | Admit: 2021-05-16 | Discharge: 2021-05-16 | Disposition: A | Discharge status: Home / Self Care | Payer: Federal, State, Local not specified - PPO | Attending: Obstetrics and Gynecology | Admitting: Obstetrics and Gynecology

## 2021-05-16 ENCOUNTER — Other Ambulatory Visit: Payer: Self-pay

## 2021-05-16 ENCOUNTER — Encounter (HOSPITAL_COMMUNITY): Payer: Self-pay | Admitting: Obstetrics and Gynecology

## 2021-05-16 ENCOUNTER — Ambulatory Visit
Admission: RE | Admit: 2021-05-16 | Discharge: 2021-05-16 | Disposition: A | Discharge status: Home / Self Care | Payer: Federal, State, Local not specified - PPO | Source: Ambulatory Visit

## 2021-05-16 ENCOUNTER — Telehealth: Payer: Self-pay | Admitting: Obstetrics and Gynecology

## 2021-05-16 VITALS — BP 113/71 | HR 78 | Temp 98.1°F | Resp 18

## 2021-05-16 DIAGNOSIS — Z3481 Encounter for supervision of other normal pregnancy, first trimester: Secondary | ICD-10-CM

## 2021-05-16 DIAGNOSIS — M25551 Pain in right hip: Secondary | ICD-10-CM | POA: Diagnosis not present

## 2021-05-16 DIAGNOSIS — Z3A28 28 weeks gestation of pregnancy: Secondary | ICD-10-CM

## 2021-05-16 DIAGNOSIS — R109 Unspecified abdominal pain: Secondary | ICD-10-CM

## 2021-05-16 DIAGNOSIS — O9A213 Injury, poisoning and certain other consequences of external causes complicating pregnancy, third trimester: Secondary | ICD-10-CM | POA: Diagnosis not present

## 2021-05-16 DIAGNOSIS — W19XXXA Unspecified fall, initial encounter: Secondary | ICD-10-CM | POA: Diagnosis not present

## 2021-05-16 DIAGNOSIS — O23593 Infection of other part of genital tract in pregnancy, third trimester: Secondary | ICD-10-CM | POA: Diagnosis present

## 2021-05-16 DIAGNOSIS — A5901 Trichomonal vulvovaginitis: Secondary | ICD-10-CM | POA: Diagnosis present

## 2021-05-16 LAB — URINALYSIS, ROUTINE W REFLEX MICROSCOPIC
Bilirubin Urine: NEGATIVE
Glucose, UA: NEGATIVE mg/dL
Hgb urine dipstick: NEGATIVE
Ketones, ur: 80 mg/dL — AB
Nitrite: NEGATIVE
Protein, ur: NEGATIVE mg/dL
Specific Gravity, Urine: 1.017 (ref 1.005–1.030)
pH: 6 (ref 5.0–8.0)

## 2021-05-16 MED ORDER — ACETAMINOPHEN 500 MG PO TABS
1000.0000 mg | ORAL_TABLET | Freq: Once | ORAL | Status: AC
  Administered 2021-05-16: 1000 mg via ORAL
  Filled 2021-05-16: qty 2

## 2021-05-16 NOTE — ED Provider Notes (Signed)
[place]   MRN: [id number] DOB: 12-Dec-2000  Subjective:   Allison Williamson is a 20 y.o. female presenting for suffering a accidental fall this morning.  Patient was knocked over by a large dog.  She ended up falling on her right hip.  Denies head injury, loss of consciousness, confusion, weakness, numbness or tingling.  She has had some intermittent transient abdominal pains and is concerned about her pregnancy.  She is [redacted] weeks pregnant.  Denies vaginal bleeding, abdominal swelling or bruising.  Has not taken any medications for relief.  No current facility-administered medications for this encounter.  Current Outpatient Medications:    Blood Pressure Monitoring (BLOOD PRESSURE KIT) DEVI, 1 kit by Does not apply route once a week., Disp: 1 each, Rfl: 0   calcium carbonate (TUMS - DOSED IN MG ELEMENTAL CALCIUM) 500 MG chewable tablet, Chew 1 tablet by mouth daily., Disp: , Rfl:    Cetirizine HCl (ZYRTEC ALLERGY) 10 MG CAPS, Take 1 capsule (10 mg total) by mouth daily. (Patient not taking: Reported on 05/07/2021), Disp: 30 capsule, Rfl: 2   ferrous sulfate 325 (65 FE) MG tablet, Take 1 tablet (325 mg total) by mouth every other day., Disp: 60 tablet, Rfl: 5   Prenatal MV & Min w/FA-DHA (PRENATAL GUMMIES) 0.18-25 MG CHEW, Chew by mouth., Disp: , Rfl:    No Known Allergies  History reviewed. No pertinent past medical history.   History reviewed. No pertinent surgical history.  Family History  Problem Relation Age of Onset   Diabetes Mother    Hypertension Mother     Social History   Tobacco Use   Smoking status: Never   Smokeless tobacco: Never  Vaping Use   Vaping Use: Never used  Substance Use Topics   Alcohol use: Never   Drug use: Never    ROS   Objective:   Vitals: BP 113/71 (BP Location: Left Arm)   Pulse 78   Temp 98.1 F (36.7 C) (Oral)   Resp 18   LMP 10/28/2020   SpO2 98%   Physical Exam Constitutional:      General: She is not  in acute distress.    Appearance: Normal appearance. She is well-developed. She is not ill-appearing, toxic-appearing or diaphoretic.  HENT:     Head: Normocephalic and atraumatic.     Nose: Nose normal.     Mouth/Throat:     Mouth: Mucous membranes are moist.     Pharynx: Oropharynx is clear.  Eyes:     General: No scleral icterus.       Right eye: No discharge.        Left eye: No discharge.     Extraocular Movements: Extraocular movements intact.     Conjunctiva/sclera: Conjunctivae normal.     Pupils: Pupils are equal, round, and reactive to light.  Cardiovascular:     Rate and Rhythm: Normal rate.  Pulmonary:     Effort: Pulmonary effort is normal.  Abdominal:     General: Bowel sounds are normal. There is no distension.     Palpations: Abdomen is soft. There is no mass.     Tenderness: There is no abdominal tenderness. There is no right CVA tenderness, left CVA tenderness, guarding or rebound.  Musculoskeletal:     Right hip: Tenderness present. No deformity, lacerations, bony tenderness or crepitus. Normal range of motion. Normal strength.  Skin:    General: Skin is warm and dry.  Neurological:     General: No focal  deficit present.     Mental Status: She is alert and oriented to person, place, and time.  Psychiatric:        Mood and Affect: Mood normal.        Behavior: Behavior normal.        Thought Content: Thought content normal.        Judgment: Judgment normal.    Assessment and Plan :   PDMP not reviewed this encounter.  1. Abdominal pain, unspecified abdominal location   2. Right hip pain   3. [redacted] weeks gestation of pregnancy     Recommended conservative management for musculoskeletal pain with Tylenol.  As she has concerns about her pregnancy and has been having abdominal pain, recommended evaluation at the maternal admission unit. Counseled patient on potential for adverse effects with medications prescribed/recommended today, ER and return-to-clinic  precautions discussed, patient verbalized understanding.    Jaynee Eagles, [place] 05/16/21 1743

## 2021-05-16 NOTE — Discharge Instructions (Signed)
Please report to the maternal admission unit for further evaluation of your abdominal pain from falling earlier today. Otherwise, for your pain you can take Tylenol at a dose of 500mg  once every 6 hours as needed for aches and pains.

## 2021-05-16 NOTE — Discharge Instructions (Signed)
You may take Tylenol 1000 mg every 8 hours as needed for pain. You may apply ice to the area on your right hip that is scratched and bruised.
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
## 2021-05-16 NOTE — MAU Note (Signed)
Allison Williamson is a 20 y.o. at [redacted]w[redacted]d here in MAU reporting: this AM around 0600, was knocked over by an 80lb dog. Pt is complaining of pain on right upper leg from fall, minor abrasion noted. No abdominal pain, bleeding, or LOF. +FM  Onset of complaint: today  Pain score: 7/10  Vitals:   05/16/21 1751  BP: (!) 61/50  Pulse: 95  Resp: 16  Temp: 98.1 F (36.7 C)  SpO2: 100%     FHT:EFM applied in room  Lab orders placed from triage: UA

## 2021-05-16 NOTE — Telephone Encounter (Signed)
TC to notify patient of (+) trichomonas results found on CCUA. Not able to leave VM. MyChart message sent to patient and message sent to clinical pool for [facility]. Patient will need tx for trichomonas at next appt on 05/21/21.  Raelyn Mora, CNM

## 2021-05-16 NOTE — ED Triage Notes (Signed)
Pt c/o 7/10 achy right hip pain s/p fall from being knocked over by large dog. Also c/o abdominal cramps, pt is [redacted] weeks pregnant. Denies vaginal bleeding.

## 2021-05-18 LAB — CULTURE, OB URINE: Culture: <10000 — AB

## 2021-05-21 ENCOUNTER — Other Ambulatory Visit: Payer: Self-pay

## 2021-05-21 ENCOUNTER — Ambulatory Visit (INDEPENDENT_AMBULATORY_CARE_PROVIDER_SITE_OTHER): Payer: Federal, State, Local not specified - PPO | Admitting: Advanced Practice Midwife

## 2021-05-21 VITALS — BP 109/66 | HR 88 | Wt 144.0 lb

## 2021-05-21 DIAGNOSIS — Z3A29 29 weeks gestation of pregnancy: Secondary | ICD-10-CM

## 2021-05-21 DIAGNOSIS — R109 Unspecified abdominal pain: Secondary | ICD-10-CM

## 2021-05-21 DIAGNOSIS — R12 Heartburn: Secondary | ICD-10-CM

## 2021-05-21 DIAGNOSIS — O26893 Other specified pregnancy related conditions, third trimester: Secondary | ICD-10-CM | POA: Insufficient documentation

## 2021-05-21 DIAGNOSIS — Z3481 Encounter for supervision of other normal pregnancy, first trimester: Secondary | ICD-10-CM

## 2021-05-21 DIAGNOSIS — O98813 Other maternal infectious and parasitic diseases complicating pregnancy, third trimester: Secondary | ICD-10-CM

## 2021-05-21 DIAGNOSIS — A749 Chlamydial infection, unspecified: Secondary | ICD-10-CM

## 2021-05-21 DIAGNOSIS — A5901 Trichomonal vulvovaginitis: Secondary | ICD-10-CM

## 2021-05-21 DIAGNOSIS — N898 Other specified noninflammatory disorders of vagina: Secondary | ICD-10-CM

## 2021-05-21 DIAGNOSIS — O23592 Infection of other part of genital tract in pregnancy, second trimester: Secondary | ICD-10-CM

## 2021-05-21 MED ORDER — AZITHROMYCIN 500 MG PO TABS: 1000.0000 mg | ORAL_TABLET | Freq: Once | ORAL | 1 refills | Status: AC

## 2021-05-21 MED ORDER — TERCONAZOLE 0.4 % VA CREA: 1.0000 | TOPICAL_CREAM | Freq: Every day | VAGINAL | 0 refills | Status: DC

## 2021-05-21 MED ORDER — FAMOTIDINE 20 MG PO TABS: 20.0000 mg | ORAL_TABLET | Freq: Two times a day (BID) | ORAL | 2 refills | Status: AC

## 2021-05-21 NOTE — Progress Notes (Signed)
   PRENATAL VISIT NOTE  Subjective:  Allison Williamson is a 20 y.o. G2P0010 at [redacted]w[redacted]d being seen today for ongoing prenatal care.  She is currently monitored for the following issues for this low-risk pregnancy and has Encounter for supervision of normal pregnancy in first trimester; Genetic carrier; Trichomonal vaginitis during pregnancy in third trimester; and Heartburn during pregnancy, third trimester on their problem list.  Patient reports vaginal irritation.  Contractions: Not present. Vag. Bleeding: None.  Movement: Present. Denies leaking of fluid.   The following portions of the patient's history were reviewed and updated as appropriate: allergies, current medications, past family history, past medical history, past social history, past surgical history and problem list.   Objective:   Vitals:   05/21/21 1540  BP: 109/66  Pulse: 88  Weight: 144 lb (65.3 kg)    Fetal Status: Fetal Heart Rate (bpm): 141   Movement: Present     General:  Alert, oriented and cooperative. Patient is in no acute distress.  Skin: Skin is warm and dry. No rash noted.   Cardiovascular: Normal heart rate noted  Respiratory: Normal respiratory effort, no problems with respiration noted  Abdomen: Soft, gravid, appropriate for gestational age.  Pain/Pressure: Absent     Pelvic: Cervical exam deferred        Extremities: Normal range of motion.  Edema: None  Mental Status: Normal mood and affect. Normal behavior. Normal judgment and thought content.   Assessment and Plan:  Pregnancy: G2P0010 at [redacted]w[redacted]d  1. Trichomonal vaginitis during pregnancy in second trimester --Pt started Flagyl on 05/17/21, is taking as prescribed --She has rx for topical yeast medication from Urgent care, will send Terazol 7 given symptoms and abx course.  2. Encounter for supervision of other normal pregnancy in first trimester --Anticipatory guidance about next visits/weeks of pregnancy given. --Next visit in 2 weeks   3.  [redacted] weeks gestation of pregnancy   4. Chlamydia infection affecting pregnancy in third trimester --Dx at Urgent Care with visit 05/17/21. Rx for Doxycycline but pt has not started.  Will send azithromycin instead as safer pregnancy option. --Expedited partner therapy for Karlene Lineman written for Azithromycin and Flagyl per protocol. - azithromycin (ZITHROMAX) 500 MG tablet; Take 2 tablets (1,000 mg total) by mouth once for 1 dose.  Dispense: 2 tablet; Refill: 1  5. Heartburn during pregnancy, third trimester  - famotidine (PEPCID) 20 MG tablet; Take 1 tablet (20 mg total) by mouth 2 (two) times daily.  Dispense: 60 tablet; Refill: 2   6. Vaginal irritation  - terconazole (TERAZOL 7) 0.4 % vaginal cream; Place 1 applicator vaginally at bedtime.  Dispense: 45 g; Refill: 0  7. Abdominal pain during pregnancy in third trimester --Mild lower abdominal pressure, worse when standing --Reviewed signs of PTL. --Rest/ice/heat/warm bath/increase PO fluids/Tylenol/pregnancy support belt   Preterm labor symptoms and general obstetric precautions including but not limited to vaginal bleeding, contractions, leaking of fluid and fetal movement were reviewed in detail with the patient. Please refer to After Visit Summary for other counseling recommendations.   Return in about 2 weeks (around 06/04/2021).  Future Appointments  Date Time Provider Department Center  06/11/2021  3:30 PM Brock Bad, MD [facility] None     Sharen Counter, CNM

## 2021-05-21 NOTE — Progress Notes (Signed)
Pt reports fetal movement, denies pain. Pt reports that she is aware of recent labs and has started antibiotics.

## 2021-05-28 DIAGNOSIS — F4323 Adjustment disorder with mixed anxiety and depressed mood: Secondary | ICD-10-CM | POA: Diagnosis not present

## 2021-06-03 ENCOUNTER — Telehealth: Payer: Self-pay

## 2021-06-03 NOTE — Telephone Encounter (Signed)
Pt called c/o dizziness to the point of near syncope that only occurs first thing in the morning when she first wakes up. Pt states she is staying hydrated, drinking 3-4 liters of water daily. When patient was asked how much she is eating per day patient states she eats only about twice a day, breakfast and lunch and maybe a snack later in the day. Pt last hemoglobin was 10.7 and she is taking iron as directed. Glucola testing was WNL except fasting was slightly low at 64.  Asked patient if the dizziness occurs when she first stands up right out of bed and pt responded yes. Advised patient she may be experiencing a little bit of drop in her blood pressure and some dips in blood sugar since she is going so long without eating. Recommended patient sit up and gradually get out of bed in the morning instead of standing straight up. Also encouraged her to eat smaller, more frequent meals throughout the day and to keep a small juice or snack by her bed that she could eat or drink in the morning if she gets that faint feeling again. Pt was advised to monitor for the next few days while trying these few things. If they continue will bring patient in for BP check. Pt has routine OB follow up on 06/11/21. Pt is agreeable with plan and to call back with any further questions or concerns.

## 2021-06-11 ENCOUNTER — Encounter: Payer: Federal, State, Local not specified - PPO | Admitting: Obstetrics

## 2021-06-11 ENCOUNTER — Encounter (HOSPITAL_COMMUNITY): Payer: Self-pay | Admitting: Radiology

## 2021-06-11 ENCOUNTER — Other Ambulatory Visit: Payer: Self-pay

## 2021-06-11 ENCOUNTER — Observation Stay (HOSPITAL_COMMUNITY)
Admission: AD | Admit: 2021-06-11 | Discharge: 2021-06-13 | Disposition: A | Discharge status: Home / Self Care | Payer: Federal, State, Local not specified - PPO | Attending: Obstetrics & Gynecology | Admitting: Obstetrics & Gynecology

## 2021-06-11 ENCOUNTER — Emergency Department (HOSPITAL_COMMUNITY): Payer: Federal, State, Local not specified - PPO

## 2021-06-11 DIAGNOSIS — Z20822 Contact with and (suspected) exposure to covid-19: Secondary | ICD-10-CM | POA: Insufficient documentation

## 2021-06-11 DIAGNOSIS — Z3A32 32 weeks gestation of pregnancy: Secondary | ICD-10-CM | POA: Insufficient documentation

## 2021-06-11 DIAGNOSIS — Z3A34 34 weeks gestation of pregnancy: Secondary | ICD-10-CM | POA: Diagnosis not present

## 2021-06-11 DIAGNOSIS — O9A213 Injury, poisoning and certain other consequences of external causes complicating pregnancy, third trimester: Secondary | ICD-10-CM | POA: Diagnosis not present

## 2021-06-11 DIAGNOSIS — K449 Diaphragmatic hernia without obstruction or gangrene: Secondary | ICD-10-CM | POA: Diagnosis not present

## 2021-06-11 DIAGNOSIS — I2699 Other pulmonary embolism without acute cor pulmonale: Secondary | ICD-10-CM | POA: Diagnosis not present

## 2021-06-11 DIAGNOSIS — A5901 Trichomonal vulvovaginitis: Secondary | ICD-10-CM | POA: Diagnosis present

## 2021-06-11 DIAGNOSIS — Z3491 Encounter for supervision of normal pregnancy, unspecified, first trimester: Secondary | ICD-10-CM

## 2021-06-11 DIAGNOSIS — R55 Syncope and collapse: Secondary | ICD-10-CM

## 2021-06-11 DIAGNOSIS — O99353 Diseases of the nervous system complicating pregnancy, third trimester: Secondary | ICD-10-CM | POA: Diagnosis not present

## 2021-06-11 DIAGNOSIS — Z3490 Encounter for supervision of normal pregnancy, unspecified, unspecified trimester: Secondary | ICD-10-CM

## 2021-06-11 DIAGNOSIS — O23593 Infection of other part of genital tract in pregnancy, third trimester: Secondary | ICD-10-CM | POA: Diagnosis present

## 2021-06-11 DIAGNOSIS — O99891 Other specified diseases and conditions complicating pregnancy: Secondary | ICD-10-CM | POA: Insufficient documentation

## 2021-06-11 DIAGNOSIS — F4323 Adjustment disorder with mixed anxiety and depressed mood: Secondary | ICD-10-CM | POA: Diagnosis not present

## 2021-06-11 DIAGNOSIS — Z3481 Encounter for supervision of other normal pregnancy, first trimester: Secondary | ICD-10-CM

## 2021-06-11 HISTORY — DX: Other specified health status: Z78.9

## 2021-06-11 LAB — TROPONIN I (HIGH SENSITIVITY): Troponin I (High Sensitivity): 4 ng/L (ref <18)

## 2021-06-11 LAB — CBC WITH DIFFERENTIAL/PLATELET
Abs Immature Granulocytes: 0.09 10*3/uL — ABNORMAL HIGH (ref 0.00–0.07)
Basophils Absolute: 0 10*3/uL (ref 0.0–0.1)
Basophils Relative: 0 %
Eosinophils Absolute: 0.1 10*3/uL (ref 0.0–0.5)
Eosinophils Relative: 1 %
HCT: 33.1 % — ABNORMAL LOW (ref 36.0–46.0)
Hemoglobin: 10.5 g/dL — ABNORMAL LOW (ref 12.0–15.0)
Immature Granulocytes: 1 %
Lymphocytes Relative: 19 %
Lymphs Abs: 1.4 10*3/uL (ref 0.7–4.0)
MCH: 32 pg (ref 26.0–34.0)
MCHC: 31.7 g/dL (ref 30.0–36.0)
MCV: 100.9 fL — ABNORMAL HIGH (ref 80.0–100.0)
Monocytes Absolute: 0.7 10*3/uL (ref 0.1–1.0)
Monocytes Relative: 9 %
Neutro Abs: 4.9 10*3/uL (ref 1.7–7.7)
Neutrophils Relative %: 70 %
Platelets: 233 10*3/uL (ref 150–400)
RBC: 3.28 MIL/uL — ABNORMAL LOW (ref 3.87–5.11)
RDW: 14.1 % (ref 11.5–15.5)
WBC: 7.2 10*3/uL (ref 4.0–10.5)
nRBC: 0 % (ref 0.0–0.2)

## 2021-06-11 LAB — TYPE AND SCREEN
ABO/RH(D): O POS
Antibody Screen: NEGATIVE

## 2021-06-11 LAB — COMPREHENSIVE METABOLIC PANEL
ALT: 15 U/L (ref 0–44)
AST: 23 U/L (ref 15–41)
Albumin: 2.8 g/dL — ABNORMAL LOW (ref 3.5–5.0)
Alkaline Phosphatase: 167 U/L — ABNORMAL HIGH (ref 38–126)
Anion gap: 8 (ref 5–15)
BUN: <5 mg/dL — ABNORMAL LOW (ref 6–20)
CO2: 20 mmol/L — ABNORMAL LOW (ref 22–32)
Calcium: 8.8 mg/dL — ABNORMAL LOW (ref 8.9–10.3)
Chloride: 109 mmol/L (ref 98–111)
Creatinine, Ser: 0.59 mg/dL (ref 0.44–1.00)
GFR, Estimated: >60 mL/min (ref >60)
Glucose, Bld: 83 mg/dL (ref 70–99)
Potassium: 3.9 mmol/L (ref 3.5–5.1)
Sodium: 137 mmol/L (ref 135–145)
Total Bilirubin: 0.6 mg/dL (ref 0.3–1.2)
Total Protein: 5.7 g/dL — ABNORMAL LOW (ref 6.5–8.1)

## 2021-06-11 LAB — RESP PANEL BY RT-PCR (FLU A&B, COVID) ARPGX2
Influenza A by PCR: NEGATIVE
Influenza B by PCR: NEGATIVE
SARS Coronavirus 2 by RT PCR: NEGATIVE

## 2021-06-11 MED ORDER — IOHEXOL 350 MG/ML SOLN
60.0000 mL | Freq: Once | INTRAVENOUS | Status: AC | PRN
  Administered 2021-06-11: 60 mL via INTRAVENOUS

## 2021-06-11 MED ORDER — DOCUSATE SODIUM 100 MG PO CAPS
100.0000 mg | ORAL_CAPSULE | Freq: Every day | ORAL | Status: DC
  Administered 2021-06-12 – 2021-06-13 (×2): 100 mg via ORAL
  Filled 2021-06-11 (×2): qty 1

## 2021-06-11 MED ORDER — ACETAMINOPHEN 325 MG PO TABS: 650.0000 mg | ORAL_TABLET | ORAL | Status: DC | PRN

## 2021-06-11 MED ORDER — ENOXAPARIN SODIUM 40 MG/0.4ML IJ SOSY
40.0000 mg | PREFILLED_SYRINGE | Freq: Every day | INTRAMUSCULAR | Status: DC
  Administered 2021-06-12 – 2021-06-13 (×2): 40 mg via SUBCUTANEOUS
  Filled 2021-06-11 (×2): qty 0.4

## 2021-06-11 MED ORDER — CALCIUM CARBONATE ANTACID 500 MG PO CHEW
2.0000 | CHEWABLE_TABLET | ORAL | Status: DC | PRN
  Administered 2021-06-11 – 2021-06-12 (×4): 400 mg via ORAL
  Filled 2021-06-11 (×4): qty 2

## 2021-06-11 MED ORDER — SODIUM CHLORIDE 0.9 % IV SOLN: INTRAVENOUS | Status: DC

## 2021-06-11 MED ORDER — PRENATAL MULTIVITAMIN CH
1.0000 | ORAL_TABLET | Freq: Every day | ORAL | Status: DC
  Administered 2021-06-11 – 2021-06-12 (×2): 1 via ORAL
  Filled 2021-06-11 (×3): qty 1

## 2021-06-11 MED ORDER — ZOLPIDEM TARTRATE 5 MG PO TABS: 5.0000 mg | ORAL_TABLET | Freq: Every evening | ORAL | Status: DC | PRN

## 2021-06-11 MED ORDER — SODIUM CHLORIDE 0.9 % IV BOLUS
1000.0000 mL | Freq: Once | INTRAVENOUS | Status: AC
  Administered 2021-06-11: 1000 mL via INTRAVENOUS
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
## 2021-06-11 NOTE — Progress Notes (Signed)
Called Education officer, environmental.  Updated on possible patient admission.

## 2021-06-11 NOTE — ED Notes (Signed)
This EMT observed the patient's heart rate increased to 120 and remained there. This EMT went to speak to the patient by calling her name and the patient seemed to be slightly dazed. This EMT asked the patient to describe how she was feeling to which the patient stated she felt dizzy. This EMT informed Melissa, Consulting civil engineer, and then informed Sarah-RN. The OB RN that was monitoring the patient also expressed the desire to give the patient fluids.
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
## 2021-06-11 NOTE — ED Notes (Signed)
OB rapid response called per RN Sima Matas request

## 2021-06-11 NOTE — Progress Notes (Signed)
Patient to CT.
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
## 2021-06-11 NOTE — Plan of Care (Signed)
  Problem: Education: Goal: Knowledge of General Education information will improve Description: Including pain rating scale, medication(s)/side effects and non-pharmacologic comfort measures Outcome: Completed/Met

## 2021-06-11 NOTE — Progress Notes (Signed)
Patient back from CT.

## 2021-06-11 NOTE — Progress Notes (Signed)
To [facility] via WC

## 2021-06-11 NOTE — ED Notes (Signed)
Patient transported to CT 
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
## 2021-06-12 ENCOUNTER — Other Ambulatory Visit: Payer: Self-pay | Admitting: Cardiology

## 2021-06-12 ENCOUNTER — Inpatient Hospital Stay (HOSPITAL_BASED_OUTPATIENT_CLINIC_OR_DEPARTMENT_OTHER): Payer: Federal, State, Local not specified - PPO

## 2021-06-12 DIAGNOSIS — R55 Syncope and collapse: Secondary | ICD-10-CM | POA: Diagnosis not present

## 2021-06-12 DIAGNOSIS — Z3A32 32 weeks gestation of pregnancy: Secondary | ICD-10-CM | POA: Diagnosis not present

## 2021-06-12 DIAGNOSIS — Z3483 Encounter for supervision of other normal pregnancy, third trimester: Secondary | ICD-10-CM | POA: Diagnosis not present

## 2021-06-12 LAB — ECHOCARDIOGRAM COMPLETE
Height: 62 in
S' Lateral: 2.5 cm
Weight: 2240 oz
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
## 2021-06-12 NOTE — Progress Notes (Signed)
Echocardiogram 2D Echocardiogram has been performed.  Warren Lacy Jowana Thumma RDCS 06/12/2021, 11:37 AM
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
## 2021-06-12 NOTE — Progress Notes (Signed)
Notified by telemetry observation for HR 150's-160's. Dr. Charlotta Newton notified. No new orders given. Carmelina Dane, RN

## 2021-06-13 ENCOUNTER — Encounter: Payer: Self-pay | Admitting: Cardiology

## 2021-06-13 DIAGNOSIS — R55 Syncope and collapse: Secondary | ICD-10-CM | POA: Diagnosis not present

## 2021-06-13 NOTE — Progress Notes (Signed)
No major events overnight. From cardiac standpoint, okay to discharge today. Outpatient cardiac telemetry will need prior authorization. Advised the patient to call our office next week. Printed a letter for absence from work and no driving.   Elder Negus, MD Pager: [phone] Office: [phone]
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
## 2021-06-13 NOTE — Progress Notes (Signed)
Discharge instructions given to pt. Discussed signs and symptoms to report to the MD, upcoming appointments, and meds. Pt verbalizes understanding and has no questions at this time. Pt discharged home from hospital in stable condition. 

## 2021-06-18 ENCOUNTER — Ambulatory Visit (INDEPENDENT_AMBULATORY_CARE_PROVIDER_SITE_OTHER): Payer: Federal, State, Local not specified - PPO | Admitting: Obstetrics

## 2021-06-18 ENCOUNTER — Encounter: Payer: Self-pay | Admitting: Obstetrics

## 2021-06-18 ENCOUNTER — Inpatient Hospital Stay: Payer: Federal, State, Local not specified - PPO

## 2021-06-18 ENCOUNTER — Other Ambulatory Visit: Payer: Self-pay

## 2021-06-18 VITALS — BP 114/69 | HR 86 | Wt 145.0 lb

## 2021-06-18 DIAGNOSIS — A5901 Trichomonal vulvovaginitis: Secondary | ICD-10-CM

## 2021-06-18 DIAGNOSIS — R12 Heartburn: Secondary | ICD-10-CM

## 2021-06-18 DIAGNOSIS — R55 Syncope and collapse: Secondary | ICD-10-CM

## 2021-06-18 DIAGNOSIS — O98813 Other maternal infectious and parasitic diseases complicating pregnancy, third trimester: Secondary | ICD-10-CM

## 2021-06-18 DIAGNOSIS — Z3480 Encounter for supervision of other normal pregnancy, unspecified trimester: Secondary | ICD-10-CM

## 2021-06-18 DIAGNOSIS — O26893 Other specified pregnancy related conditions, third trimester: Secondary | ICD-10-CM

## 2021-06-18 DIAGNOSIS — A749 Chlamydial infection, unspecified: Secondary | ICD-10-CM

## 2021-06-18 DIAGNOSIS — O23593 Infection of other part of genital tract in pregnancy, third trimester: Secondary | ICD-10-CM

## 2021-06-18 DIAGNOSIS — Z23 Encounter for immunization: Secondary | ICD-10-CM | POA: Diagnosis not present

## 2021-06-18 NOTE — Progress Notes (Signed)
Patient presents for ROB. Patient states that she and her partner both completed their Std treatment and abstained from intercourse for at least a week after treatment.

## 2021-06-18 NOTE — Progress Notes (Signed)
Subjective:  Allison Williamson is a 20 y.o. G2P0010 at [redacted]w[redacted]d being seen today for ongoing prenatal care.  She is currently monitored for the following issues for this low-risk pregnancy and has Encounter for supervision of normal pregnancy in first trimester; Genetic carrier; Trichomonal vaginitis during pregnancy in third trimester; Heartburn during pregnancy, third trimester; and Syncope on their problem list.  Patient reports no complaints.  Contractions: Irritability. Vag. Bleeding: None.  Movement: Present. Denies leaking of fluid.   The following portions of the patient's history were reviewed and updated as appropriate: allergies, current medications, past family history, past medical history, past social history, past surgical history and problem list. Problem list updated.  Objective:   Vitals:   06/18/21 1111  BP: 114/69  Pulse: 86  Weight: 145 lb (65.8 kg)    Fetal Status:     Movement: Present     General:  Alert, oriented and cooperative. Patient is in no acute distress.  Skin: Skin is warm and dry. No rash noted.   Cardiovascular: Normal heart rate noted  Respiratory: Normal respiratory effort, no problems with respiration noted  Abdomen: Soft, gravid, appropriate for gestational age. Pain/Pressure: Absent     Pelvic:  Cervical exam deferred        Extremities: Normal range of motion.  Edema: None  Mental Status: Normal mood and affect. Normal behavior. Normal judgment and thought content.   Urinalysis:      Assessment and Plan:  Pregnancy: G2P0010 at [redacted]w[redacted]d  1. Supervision of other normal pregnancy, antepartum  2. Trichomonal vaginitis during pregnancy in third trimester, treated - TOC at 36 weeks  3. Chlamydia infection affecting pregnancy in third trimester - TOC at 36 weeks  4. Heartburn during pregnancy, third trimester - clinically stable on Tums prn  5. Encounter for immunization Rx: - Flu Vaccine QUAD 36+ mos IM (Fluarix, Quad PF)   Preterm  labor symptoms and general obstetric precautions including but not limited to vaginal bleeding, contractions, leaking of fluid and fetal movement were reviewed in detail with the patient. Please refer to After Visit Summary for other counseling recommendations.   Return in about 2 weeks (around 07/02/2021) for ROB.   Brock Bad, MD  06/18/21

## 2021-06-20 ENCOUNTER — Inpatient Hospital Stay (HOSPITAL_COMMUNITY)
Admission: AD | Admit: 2021-06-20 | Discharge: 2021-06-20 | Disposition: A | Discharge status: Home / Self Care | Payer: Federal, State, Local not specified - PPO | Attending: Obstetrics and Gynecology | Admitting: Obstetrics and Gynecology

## 2021-06-20 ENCOUNTER — Encounter (HOSPITAL_COMMUNITY): Payer: Self-pay | Admitting: Obstetrics and Gynecology

## 2021-06-20 ENCOUNTER — Other Ambulatory Visit: Payer: Self-pay

## 2021-06-20 DIAGNOSIS — E161 Other hypoglycemia: Secondary | ICD-10-CM | POA: Diagnosis not present

## 2021-06-20 DIAGNOSIS — R000 Tachycardia, unspecified: Secondary | ICD-10-CM | POA: Diagnosis not present

## 2021-06-20 DIAGNOSIS — O26893 Other specified pregnancy related conditions, third trimester: Secondary | ICD-10-CM | POA: Insufficient documentation

## 2021-06-20 DIAGNOSIS — O99891 Other specified diseases and conditions complicating pregnancy: Secondary | ICD-10-CM

## 2021-06-20 DIAGNOSIS — R42 Dizziness and giddiness: Secondary | ICD-10-CM

## 2021-06-20 DIAGNOSIS — R55 Syncope and collapse: Secondary | ICD-10-CM | POA: Diagnosis not present

## 2021-06-20 DIAGNOSIS — Z3A33 33 weeks gestation of pregnancy: Secondary | ICD-10-CM

## 2021-06-20 DIAGNOSIS — Z113 Encounter for screening for infections with a predominantly sexual mode of transmission: Secondary | ICD-10-CM

## 2021-06-20 DIAGNOSIS — Z9189 Other specified personal risk factors, not elsewhere classified: Secondary | ICD-10-CM

## 2021-06-20 DIAGNOSIS — E162 Hypoglycemia, unspecified: Secondary | ICD-10-CM | POA: Diagnosis not present

## 2021-06-20 LAB — TSH: TSH: 1.008 u[IU]/mL (ref 0.350–4.500)

## 2021-06-20 LAB — CBC WITH DIFFERENTIAL/PLATELET
Abs Immature Granulocytes: 0.13 10*3/uL — ABNORMAL HIGH (ref 0.00–0.07)
Basophils Absolute: 0 10*3/uL (ref 0.0–0.1)
Basophils Relative: 0 %
Eosinophils Absolute: 0 10*3/uL (ref 0.0–0.5)
Eosinophils Relative: 0 %
HCT: 35.4 % — ABNORMAL LOW (ref 36.0–46.0)
Hemoglobin: 11.8 g/dL — ABNORMAL LOW (ref 12.0–15.0)
Immature Granulocytes: 2 %
Lymphocytes Relative: 15 %
Lymphs Abs: 1 10*3/uL (ref 0.7–4.0)
MCH: 32.6 pg (ref 26.0–34.0)
MCHC: 33.3 g/dL (ref 30.0–36.0)
MCV: 97.8 fL (ref 80.0–100.0)
Monocytes Absolute: 0.7 10*3/uL (ref 0.1–1.0)
Monocytes Relative: 11 %
Neutro Abs: 5 10*3/uL (ref 1.7–7.7)
Neutrophils Relative %: 72 %
Platelets: 255 10*3/uL (ref 150–400)
RBC: 3.62 MIL/uL — ABNORMAL LOW (ref 3.87–5.11)
RDW: 13.9 % (ref 11.5–15.5)
WBC: 6.9 10*3/uL (ref 4.0–10.5)
nRBC: 0 % (ref 0.0–0.2)

## 2021-06-20 LAB — WET PREP, GENITAL
Clue Cells Wet Prep HPF POC: NONE SEEN
Sperm: NONE SEEN
Trich, Wet Prep: NONE SEEN
Yeast Wet Prep HPF POC: NONE SEEN

## 2021-06-20 LAB — RAPID URINE DRUG SCREEN, HOSP PERFORMED
Amphetamines: NOT DETECTED
Barbiturates: NOT DETECTED
Benzodiazepines: NOT DETECTED
Cocaine: NOT DETECTED
Opiates: NOT DETECTED
Tetrahydrocannabinol: NOT DETECTED

## 2021-06-20 LAB — COMPREHENSIVE METABOLIC PANEL
ALT: 19 U/L (ref 0–44)
AST: 19 U/L (ref 15–41)
Albumin: 3 g/dL — ABNORMAL LOW (ref 3.5–5.0)
Alkaline Phosphatase: 222 U/L — ABNORMAL HIGH (ref 38–126)
Anion gap: 9 (ref 5–15)
BUN: <5 mg/dL — ABNORMAL LOW (ref 6–20)
CO2: 20 mmol/L — ABNORMAL LOW (ref 22–32)
Calcium: 9.1 mg/dL (ref 8.9–10.3)
Chloride: 107 mmol/L (ref 98–111)
Creatinine, Ser: 0.6 mg/dL (ref 0.44–1.00)
GFR, Estimated: >60 mL/min (ref >60)
Glucose, Bld: 68 mg/dL — ABNORMAL LOW (ref 70–99)
Potassium: 4.2 mmol/L (ref 3.5–5.1)
Sodium: 136 mmol/L (ref 135–145)
Total Bilirubin: 0.6 mg/dL (ref 0.3–1.2)
Total Protein: 6.5 g/dL (ref 6.5–8.1)

## 2021-06-20 LAB — URINALYSIS, ROUTINE W REFLEX MICROSCOPIC
Bilirubin Urine: NEGATIVE
Glucose, UA: NEGATIVE mg/dL
Hgb urine dipstick: NEGATIVE
Ketones, ur: 20 mg/dL — AB
Leukocytes,Ua: NEGATIVE
Nitrite: NEGATIVE
Protein, ur: NEGATIVE mg/dL
Specific Gravity, Urine: 1.008 (ref 1.005–1.030)
pH: 8 (ref 5.0–8.0)

## 2021-06-20 LAB — GLUCOSE, CAPILLARY: Glucose-Capillary: 65 mg/dL — ABNORMAL LOW (ref 70–99)

## 2021-06-20 MED ORDER — LACTATED RINGERS IV BOLUS
1000.0000 mL | Freq: Once | INTRAVENOUS | Status: AC
  Administered 2021-06-20: 1000 mL via INTRAVENOUS

## 2021-06-20 NOTE — MAU Note (Signed)
Presents stating @ 1230 this afternoon she felt like she was going to pass out & the room went dark.  Didn't actually pass out, but lowered to floor onto her left side. Denies LOF or VB.  Endorses +FM.

## 2021-06-20 NOTE — MAU Provider Note (Signed)
History     CSN: [id number]  Arrival date and time: 06/20/21 1339   Event Date/Time   First Provider Initiated Contact with Patient 06/20/21 1434      No chief complaint on file.  HPI  Ms.Allison Williamson is a 20 y.o. female G2P0010 @ 67w4dhere in MAU with complaints of dizziness and near syncompe. This is not a new problem. She has struggled with this for a few weeks now. She woke up at 7 am this morning and at 7:30 she had a starbucks drink and a few bites of a pastry. At 11:30 she arrived at a social event with plans to eat lunch, however started not feeling well. She became hot, sweaty and dizzy. She laid down and 911 was called. She confirms that she essentially has not had anything to eat today.  She has been seen by cardiology and is currently wearing a cardiac heart monitor. She has no dizziness or syncope symptoms currently.   She has no chest pain or SOB.   Patient had a normal cardiac echo last month and a negative CT angio /chest.   OB History     Gravida  2   Para      Term      Preterm      AB  1   Living         SAB  1   IAB      Ectopic      Multiple      Live Births              Past Medical History:  Diagnosis Date   Medical history non-contributory     Past Surgical History:  Procedure Laterality Date   NO PAST SURGERIES      Family History  Problem Relation Age of Onset   Diabetes Mother    Hypertension Mother     Social History   Tobacco Use   Smoking status: Never   Smokeless tobacco: Never  Vaping Use   Vaping Use: Never used  Substance Use Topics   Alcohol use: Never   Drug use: Never    Allergies: No Known Allergies  Medications Prior to Admission  Medication Sig Dispense Refill Last Dose   famotidine (PEPCID) 20 MG tablet Take 1 tablet (20 mg total) by mouth 2 (two) times daily. 60 tablet 2 06/20/2021   ferrous sulfate 325 (65 FE) MG tablet Take 1 tablet (325 mg total) by mouth every other day. 60 tablet  5 06/19/2021   Prenatal MV & Min w/FA-DHA (PRENATAL GUMMIES) 0.18-25 MG CHEW Chew by mouth.   06/19/2021   Blood Pressure Monitoring (BLOOD PRESSURE KIT) DEVI 1 kit by Does not apply route once a week. 1 each 0    calcium carbonate (TUMS - DOSED IN MG ELEMENTAL CALCIUM) 500 MG chewable tablet Chew 1 tablet by mouth daily.      Results for orders placed or performed during the hospital encounter of 06/20/21 (from the past 48 hour(s))  CBC with Differential/Platelet     Status: Abnormal   Collection Time: 06/20/21  2:21 PM  Result Value Ref Range   WBC 6.9 4.0 - 10.5 K/uL   RBC 3.62 (L) 3.87 - 5.11 MIL/uL   Hemoglobin 11.8 (L) 12.0 - 15.0 g/dL   HCT 35.4 (L) 36.0 - 46.0 %   MCV 97.8 80.0 - 100.0 fL   MCH 32.6 26.0 - 34.0 pg   MCHC 33.3 30.0 - 36.0 g/dL  RDW 13.9 11.5 - 15.5 %   Platelets 255 150 - 400 K/uL   nRBC 0.0 0.0 - 0.2 %   Neutrophils Relative % 72 %   Neutro Abs 5.0 1.7 - 7.7 K/uL   Lymphocytes Relative 15 %   Lymphs Abs 1.0 0.7 - 4.0 K/uL   Monocytes Relative 11 %   Monocytes Absolute 0.7 0.1 - 1.0 K/uL   Eosinophils Relative 0 %   Eosinophils Absolute 0.0 0.0 - 0.5 K/uL   Basophils Relative 0 %   Basophils Absolute 0.0 0.0 - 0.1 K/uL   Immature Granulocytes 2 %   Abs Immature Granulocytes 0.13 (H) 0.00 - 0.07 K/uL    Comment: Performed at [facility] [address] [place], [place] [address]  Urinalysis, Routine w reflex microscopic Urine, Clean Catch     Status: Abnormal   Collection Time: 06/20/21  2:33 PM  Result Value Ref Range   Color, Urine YELLOW YELLOW   APPearance CLEAR CLEAR   Specific Gravity, Urine 1.008 1.005 - 1.030   pH 8.0 5.0 - 8.0   Glucose, UA NEGATIVE NEGATIVE mg/dL   Hgb urine dipstick NEGATIVE NEGATIVE   Bilirubin Urine NEGATIVE NEGATIVE   Ketones, ur 20 (A) NEGATIVE mg/dL   Protein, ur NEGATIVE NEGATIVE mg/dL   Nitrite NEGATIVE NEGATIVE   Leukocytes,Ua NEGATIVE NEGATIVE    Comment: Performed at [facility] [address], [place], [place] [address]  Comprehensive metabolic panel     Status: Abnormal   Collection Time: 06/20/21  2:49 PM  Result Value Ref Range   Sodium 136 135 - 145 mmol/L   Potassium 4.2 3.5 - 5.1 mmol/L   Chloride 107 98 - 111 mmol/L   CO2 20 (L) 22 - 32 mmol/L   Glucose, Bld 68 (L) 70 - 99 mg/dL    Comment: Glucose reference range applies only to samples taken after fasting for at least 8 hours.   BUN <5 (L) 6 - 20 mg/dL   Creatinine, Ser 0.60 0.44 - 1.00 mg/dL   Calcium 9.1 8.9 - 10.3 mg/dL   Total Protein 6.5 6.5 - 8.1 g/dL   Albumin 3.0 (L) 3.5 - 5.0 g/dL   AST 19 15 - 41 U/L   ALT 19 0 - 44 U/L   Alkaline Phosphatase 222 (H) 38 - 126 U/L   Total Bilirubin 0.6 0.3 - 1.2 mg/dL   GFR, Estimated >60 >60 mL/min    Comment: (NOTE) Calculated using the CKD-EPI Creatinine Equation (2021)    Anion gap 9 5 - 15    Comment: Performed at [facility] [address], [place], [place] [address]  Glucose, capillary     Status: Abnormal   Collection Time: 06/20/21  2:49 PM  Result Value Ref Range   Glucose-Capillary 65 (L) 70 - 99 mg/dL    Comment: Glucose reference range applies only to samples taken after fasting for at least 8 hours.  TSH     Status: None   Collection Time: 06/20/21  3:15 PM  Result Value Ref Range   TSH 1.008 0.350 - 4.500 uIU/mL    Comment: Performed by a 3rd Generation assay with a functional sensitivity of <=0.01 uIU/mL. Performed at [facility], [place] [address] [place], [place] [address]   Urine rapid drug screen (hosp performed)     Status: None   Collection Time: 06/20/21  3:15 PM  Result Value Ref Range   Opiates NONE DETECTED  NONE DETECTED   Cocaine NONE DETECTED NONE DETECTED   Benzodiazepines NONE DETECTED NONE DETECTED   Amphetamines NONE DETECTED NONE DETECTED   Tetrahydrocannabinol NONE DETECTED NONE DETECTED   Barbiturates NONE DETECTED NONE DETECTED    Comment: (NOTE) DRUG SCREEN FOR MEDICAL PURPOSES ONLY.  IF CONFIRMATION IS  NEEDED FOR ANY PURPOSE, NOTIFY LAB WITHIN 5 DAYS.  LOWEST DETECTABLE LIMITS FOR URINE DRUG SCREEN Drug Class                     Cutoff (ng/mL) Amphetamine and metabolites    1000 Barbiturate and metabolites    200 Benzodiazepine                 594 Tricyclics and metabolites     300 Opiates and metabolites        300 Cocaine and metabolites        300 THC                            50 Performed at [facility], [place] [address], [place], [place] [address]   Wet prep, genital     Status: Abnormal   Collection Time: 06/20/21  5:06 PM   Specimen: PATH Cytology Cervicovaginal Ancillary Only  Result Value Ref Range   Yeast Wet Prep HPF POC NONE SEEN NONE SEEN   Trich, Wet Prep NONE SEEN NONE SEEN   Clue Cells Wet Prep HPF POC NONE SEEN NONE SEEN   WBC, Wet Prep HPF POC FEW (A) NONE SEEN   Sperm NONE SEEN     Comment: Performed at [facility], [place] [address] [place], [place] [address]    Review of Systems  Eyes:  Negative for photophobia.  Respiratory:  Negative for chest tightness and shortness of breath.   Gastrointestinal:  Negative for abdominal pain.  Genitourinary:  Negative for vaginal bleeding.  Neurological:  Negative for dizziness, syncope and light-headedness.  Physical Exam   Blood pressure 111/71, pulse 80, temperature 98.1 F (36.7 C), temperature source Oral, resp. rate 20, last menstrual period 10/28/2020, SpO2 99 %, unknown if currently breastfeeding.  Physical Exam Vitals and nursing note reviewed.  Constitutional:      General: She is not in acute distress.    Appearance: Normal appearance. She is not ill-appearing, toxic-appearing or diaphoretic.  HENT:     Head: Normocephalic.  Cardiovascular:     Rate and Rhythm: Tachycardia present.  Pulmonary:     Effort: Pulmonary effort is normal.     Breath sounds: Normal breath sounds.  Skin:    General: Skin is warm.  Neurological:     Mental Status: She is alert and oriented to person, place,  and time.  Psychiatric:        Behavior: Behavior normal.   Fetal Tracing: Baseline: 125 bpm Variability: Moderate  Accelerations: 15x15 Decelerations: None Toco:  None  MAU Course  Procedures None  MDM  TSH normal EKG: normal sinus tachycardia: reviewed with Dr. Gwenlyn Perking LR bolus X 1 liter, and a meal provided. Patient was walked around the MAU unit with RN and did not develop dizziness or light headedness.  CBG 64 Orthostatic vitals normal   Assessment and Plan   A:  1. Near syncope   2. Has poorly balanced diet   3. Screening for STDs (sexually transmitted diseases)   4. [redacted] weeks gestation of pregnancy   5. Tachycardia  P:  TOC collected, recent chlamydia and trichomonas  Discharge home in stable condition Discussed diet in detail. Add protein. Eat every 2-3 hours Increase oral hydration Would consider nutrition consult if symptoms continue.  Return to MAU if symptoms worsen F/u with Cards as planned  Jariel Drost, Artist Pais, NP 06/20/2021 6:20 PM

## 2021-06-23 LAB — GC/CHLAMYDIA PROBE AMP (~~LOC~~) NOT AT ARMC
Chlamydia: NEGATIVE
Comment: NEGATIVE
Comment: NORMAL
Neisseria Gonorrhea: NEGATIVE

## 2021-06-25 DIAGNOSIS — F4323 Adjustment disorder with mixed anxiety and depressed mood: Secondary | ICD-10-CM | POA: Diagnosis not present

## 2021-06-27 ENCOUNTER — Telehealth: Payer: Self-pay | Admitting: *Deleted

## 2021-06-27 NOTE — Telephone Encounter (Signed)
Pt called to office stating she has been having more nausea and not feeling well when she wakes up in the mornings.  Pt states she wakes up at 6 am and feels it's too early for her to eat at that time.  Pt was advised to have a snack before bedtime and a small snack when she wakes up. Pt made aware to stay well hydrated, getting plenty of water/fluids.  Pt also made aware she may try to add snacks during the day if she feels her diet is not well comprised, or she may choose to snack throughout the day to help prevent nausea.  Pt made aware to try diet changes and if no relief to discuss at next appt or contact the office.   Pt states understanding.

## 2021-07-02 ENCOUNTER — Other Ambulatory Visit: Payer: Self-pay

## 2021-07-02 ENCOUNTER — Ambulatory Visit (INDEPENDENT_AMBULATORY_CARE_PROVIDER_SITE_OTHER): Payer: Federal, State, Local not specified - PPO | Admitting: Obstetrics

## 2021-07-02 ENCOUNTER — Encounter: Payer: Self-pay | Admitting: Obstetrics

## 2021-07-02 VITALS — BP 120/71 | HR 93 | Wt 149.0 lb

## 2021-07-02 DIAGNOSIS — Z3480 Encounter for supervision of other normal pregnancy, unspecified trimester: Secondary | ICD-10-CM

## 2021-07-02 NOTE — Progress Notes (Signed)
Pt presents for ROB without complaints today.  

## 2021-07-02 NOTE — Progress Notes (Signed)
Subjective:  Allison Williamson is a 20 y.o. G2P0010 at [redacted]w[redacted]d being seen today for ongoing prenatal care.  She is currently monitored for the following issues for this low-risk pregnancy and has Encounter for supervision of normal pregnancy, antepartum; Genetic carrier; Trichomonal vaginitis during pregnancy in third trimester; Heartburn during pregnancy, third trimester; and Syncope on their problem list.  Patient reports no complaints.  Contractions: Irritability. Vag. Bleeding: None.  Movement: Present. Denies leaking of fluid.   The following portions of the patient's history were reviewed and updated as appropriate: allergies, current medications, past family history, past medical history, past social history, past surgical history and problem list. Problem list updated.  Objective:   Vitals:   07/02/21 0855  BP: 120/71  Pulse: 93  Weight: 149 lb (67.6 kg)    Fetal Status:     Movement: Present     General:  Alert, oriented and cooperative. Patient is in no acute distress.  Skin: Skin is warm and dry. No rash noted.   Cardiovascular: Normal heart rate noted  Respiratory: Normal respiratory effort, no problems with respiration noted  Abdomen: Soft, gravid, appropriate for gestational age. Pain/Pressure: Absent     Pelvic:  Cervical exam deferred        Extremities: Normal range of motion.  Edema: None  Mental Status: Normal mood and affect. Normal behavior. Normal judgment and thought content.   Urinalysis:      Assessment and Plan:  Pregnancy: G2P0010 at [redacted]w[redacted]d  1. Supervision of other normal pregnancy, antepartum   Preterm labor symptoms and general obstetric precautions including but not limited to vaginal bleeding, contractions, leaking of fluid and fetal movement were reviewed in detail with the patient. Please refer to After Visit Summary for other counseling recommendations.   Return in about 1 week (around 07/09/2021) for ROB.   Brock Bad, MD  07/02/21

## 2021-07-09 ENCOUNTER — Encounter: Payer: Self-pay | Admitting: Obstetrics & Gynecology

## 2021-07-09 ENCOUNTER — Other Ambulatory Visit (HOSPITAL_COMMUNITY)
Admission: RE | Admit: 2021-07-09 | Discharge: 2021-07-09 | Disposition: A | Discharge status: Home / Self Care | Payer: Federal, State, Local not specified - PPO | Source: Ambulatory Visit | Attending: Obstetrics & Gynecology | Admitting: Obstetrics & Gynecology

## 2021-07-09 ENCOUNTER — Other Ambulatory Visit: Payer: Self-pay

## 2021-07-09 ENCOUNTER — Ambulatory Visit (INDEPENDENT_AMBULATORY_CARE_PROVIDER_SITE_OTHER): Payer: Federal, State, Local not specified - PPO | Admitting: Obstetrics & Gynecology

## 2021-07-09 VITALS — BP 118/70 | HR 94 | Wt 151.2 lb

## 2021-07-09 DIAGNOSIS — Z3A36 36 weeks gestation of pregnancy: Secondary | ICD-10-CM | POA: Insufficient documentation

## 2021-07-09 DIAGNOSIS — Z3480 Encounter for supervision of other normal pregnancy, unspecified trimester: Secondary | ICD-10-CM | POA: Diagnosis not present

## 2021-07-09 DIAGNOSIS — F4323 Adjustment disorder with mixed anxiety and depressed mood: Secondary | ICD-10-CM | POA: Diagnosis not present

## 2021-07-09 NOTE — Patient Instructions (Signed)
Return to office for any scheduled appointments. Call the office or go to the MAU at [facility] if:  You begin to have strong, frequent contractions  Your water breaks.  Sometimes it is a big gush of fluid, sometimes it is just a trickle that keeps getting your panties wet or running down your legs  You have vaginal bleeding.  It is normal to have a small amount of spotting if your cervix was checked.   You do not feel your baby moving like normal.  If you do not, get something to eat and drink and lay down and focus on feeling your baby move.   If your baby is still not moving like normal, you should call the office or go to MAU.  Any other obstetric concerns.   

## 2021-07-09 NOTE — Progress Notes (Signed)
Patient presents for ROB. Patient has no concerns today. 

## 2021-07-09 NOTE — Progress Notes (Signed)
   PRENATAL VISIT NOTE  Subjective:  Allison Williamson is a 20 y.o. G2P0010 at [redacted]w[redacted]d being seen today for ongoing prenatal care.  She is currently monitored for the following issues for this low-risk pregnancy and has Encounter for supervision of normal pregnancy, antepartum; Genetic carrier; Trichomonal vaginitis during pregnancy in third trimester; Heartburn during pregnancy, third trimester; and Syncope on their problem list.  Patient reports no complaints.  Contractions: Irritability. Vag. Bleeding: None.  Movement: Present. Denies leaking of fluid.   The following portions of the patient's history were reviewed and updated as appropriate: allergies, current medications, past family history, past medical history, past social history, past surgical history and problem list.   Objective:   Vitals:   07/09/21 0849  BP: 118/70  Pulse: 94  Weight: 151 lb 3.2 oz (68.6 kg)    Fetal Status: Fetal Heart Rate (bpm): 145 Fundal Height: 36 cm Movement: Present  Presentation: Vertex on bedside ultrasound  General:  Alert, oriented and cooperative. Patient is in no acute distress.  Skin: Skin is warm and dry. No rash noted.   Cardiovascular: Normal heart rate noted  Respiratory: Normal respiratory effort, no problems with respiration noted  Abdomen: Soft, gravid, appropriate for gestational age.  Pain/Pressure: Present     Pelvic: Cervical exam deferred      Pelvic cultures collected in the presence of a chaperone.  Extremities: Normal range of motion.  Edema: None  Mental Status: Normal mood and affect. Normal behavior. Normal judgment and thought content.   Assessment and Plan:  Pregnancy: G2P0010 at [redacted]w[redacted]d 1. [redacted] weeks gestation of pregnancy 2. Supervision of other normal pregnancy, antepartum Pelvic cultures done today, will follow up results and manage accordingly. - Cervicovaginal ancillary only - Strep Gp B NAA Preterm labor symptoms and general obstetric precautions including  but not limited to vaginal bleeding, contractions, leaking of fluid and fetal movement were reviewed in detail with the patient. Please refer to After Visit Summary for other counseling recommendations.   Return in about 1 week (around 07/16/2021) for OFFICE OB VISIT (MD or APP).  No future appointments.  Jaynie Collins, MD

## 2021-07-10 LAB — CERVICOVAGINAL ANCILLARY ONLY
Chlamydia: NEGATIVE
Comment: NEGATIVE
Comment: NORMAL
Neisseria Gonorrhea: NEGATIVE

## 2021-07-11 DIAGNOSIS — R55 Syncope and collapse: Secondary | ICD-10-CM | POA: Diagnosis not present

## 2021-07-11 LAB — STREP GP B NAA: Strep Gp B NAA: NEGATIVE

## 2021-07-11 NOTE — Progress Notes (Signed)
No significant heart muscle circulation abnormalities noted on stress test. Best wishes for rest of the pregnancy.  Thanks MJP

## 2021-07-15 NOTE — Progress Notes (Signed)
Called patient, NA, LMAM

## 2021-07-16 ENCOUNTER — Other Ambulatory Visit: Payer: Self-pay

## 2021-07-16 ENCOUNTER — Ambulatory Visit (INDEPENDENT_AMBULATORY_CARE_PROVIDER_SITE_OTHER): Payer: Federal, State, Local not specified - PPO | Admitting: Women's Health

## 2021-07-16 VITALS — BP 111/71 | HR 152 | Wt 150.0 lb

## 2021-07-16 DIAGNOSIS — O23593 Infection of other part of genital tract in pregnancy, third trimester: Secondary | ICD-10-CM

## 2021-07-16 DIAGNOSIS — Z3480 Encounter for supervision of other normal pregnancy, unspecified trimester: Secondary | ICD-10-CM

## 2021-07-16 DIAGNOSIS — Z3A37 37 weeks gestation of pregnancy: Secondary | ICD-10-CM

## 2021-07-16 DIAGNOSIS — Z148 Genetic carrier of other disease: Secondary | ICD-10-CM

## 2021-07-16 DIAGNOSIS — R55 Syncope and collapse: Secondary | ICD-10-CM

## 2021-07-16 DIAGNOSIS — A5901 Trichomonal vulvovaginitis: Secondary | ICD-10-CM

## 2021-07-16 NOTE — Progress Notes (Signed)
Attempted to call pt, went straight to vm. Left vm requesting call back.

## 2021-07-16 NOTE — Progress Notes (Signed)
Called and spoke to pt, pt voiced understanding.

## 2021-07-16 NOTE — Patient Instructions (Signed)
Maternity Assessment Unit (MAU)  The Maternity Assessment Unit (MAU) is located at the [facility] at [facility]. The address is: [address], [place], [place], [place] [address]. Please see map below for additional directions.    The Maternity Assessment Unit is designed to help you during your pregnancy, and for up to 6 weeks after delivery, with any pregnancy- or postpartum-related emergencies, if you think you are in labor, or if your water has broken. For example, if you experience nausea and vomiting, vaginal bleeding, severe abdominal or pelvic pain, elevated blood pressure or other problems related to your pregnancy or postpartum time, please come to the Maternity Assessment Unit for assistance.       Signs and Symptoms of Labor Labor is the body's natural process of moving the baby and the placenta out of the uterus. The process of labor usually starts when the baby is full-term, between 34 and 40 weeks of pregnancy. Signs and symptoms that you are close to going into labor As your body prepares for labor and the birth of your baby, you may notice the following symptoms in the weeks and days before true labor starts: Passing a small amount of thick, bloody mucus from your vagina. This is called normal bloody show or losing your mucus plug. This may happen more than a week before labor begins, or right before labor begins, as the opening of the cervix starts to widen (dilate). For some women, the entire mucus plug passes at once. For others, pieces of the mucus plug may gradually pass over several days. Your baby moving (dropping) lower in your pelvis to get into position for birth (lightening). When this happens, you may feel more pressure on your bladder and pelvic bone and less pressure on your ribs. This may make it easier to breathe. It may also cause you to need to urinate more often and have problems with bowel movements. Having "practice  contractions," also called Braxton Hicks contractions or false labor. These occur at irregular (unevenly spaced) intervals that are more than 10 minutes apart. False labor contractions are common after exercise or sexual activity. They will stop if you change position, rest, or drink fluids. These contractions are usually mild and do not get stronger over time. They may feel like: A backache or back pain. Mild cramps, similar to menstrual cramps. Tightening or pressure in your abdomen. Other early symptoms include: Nausea or loss of appetite. Diarrhea. Having a sudden burst of energy, or feeling very tired. Mood changes. Having trouble sleeping. Signs and symptoms that labor has begun Signs that you are in labor may include: Having contractions that come at regular (evenly spaced) intervals and increase in intensity. This may feel like more intense tightening or pressure in your abdomen that moves to your back. Contractions may also feel like rhythmic pain in your upper thighs or back that comes and goes at regular intervals. For first-time mothers, this change in intensity of contractions often occurs at a more gradual pace. Women who have given birth before may notice a more rapid progression of contraction changes. Feeling pressure in the vaginal area. Your water breaking (rupture of membranes). This is when the sac of fluid that surrounds your baby breaks. Fluid leaking from your vagina may be clear or blood-tinged. Labor usually starts within 24 hours of your water breaking, but it may take longer to begin. Some women may feel a sudden gush of fluid. Others notice that their underwear repeatedly  becomes damp. Follow these instructions at home:  When labor starts, or if your water breaks, call your health care provider or nurse care line. Based on your situation, they will determine when you should go in for an exam. During early labor, you may be able to rest and manage symptoms at home.  Some strategies to try at home include: Breathing and relaxation techniques. Taking a warm bath or shower. Listening to music. Using a heating pad on the lower back for pain. If you are directed to use heat: Place a towel between your skin and the heat source. Leave the heat on for 20-30 minutes. Remove the heat if your skin turns bright red. This is especially important if you are unable to feel pain, heat, or cold. You may have a greater risk of getting burned. Contact a health care provider if: Your labor has started. Your water breaks. Get help right away if: You have painful, regular contractions that are 5 minutes apart or less. Labor starts before you are [redacted] weeks along in your pregnancy. You have a fever. You have bright red blood coming from your vagina. You do not feel your baby moving. You have a severe headache with or without vision problems. You have severe nausea, vomiting, or diarrhea. You have chest pain or shortness of breath. These symptoms may represent a serious problem that is an emergency. Do not wait to see if the symptoms will go away. Get medical help right away. Call your local emergency services (911 in the [place] Do not drive yourself to the hospital. Summary Labor is your body's natural process of moving your baby and the placenta out of your uterus. The process of labor usually starts when your baby is full-term, between 21 and 40 weeks of pregnancy. When labor starts, or if your water breaks, call your health care provider or nurse care line. Based on your situation, they will determine when you should go in for an exam. This information is not intended to replace advice given to you by your health care provider. Make sure you discuss any questions you have with your health care provider. Document Revised: 06/22/2020 Document Reviewed: 06/22/2020 Elsevier Patient Education  2022 Elsevier Inc.       [employer] of the  uterus can occur throughout pregnancy, but they are not always a sign that you are in labor. You may have practice contractions called Braxton Hicks contractions. These false labor contractions are sometimes confused with true labor. What are Deberah Pelton contractions? Braxton Hicks contractions are tightening movements that occur in the muscles of the uterus before labor. Unlike true labor contractions, these contractions do not result in opening (dilation) and thinning of the lowest part of the uterus (cervix). Toward the end of pregnancy (32-34 weeks), Braxton Hicks contractions can happen more often and may become stronger. These contractions are sometimes difficult to tell apart from true labor because they can be very uncomfortable. How to tell the difference between true labor and false labor True labor Contractions last 30-70 seconds. Contractions become very regular. Discomfort is usually felt in the top of the uterus, and it spreads to the lower abdomen and low back. Contractions do not go away with walking. Contractions usually become stronger and more frequent. The cervix dilates and gets thinner. False labor Contractions are usually shorter, weaker, and farther apart than true labor contractions. Contractions are usually irregular. Contractions are often felt in the front of the lower abdomen and in the  groin. Contractions may go away when you walk around or change positions while lying down. The cervix usually does not dilate or become thin. Sometimes, the only way to tell if you are in true labor is for your health care provider to look for changes in your cervix. Your health care provider will do a physical exam and may monitor your contractions. If you are in true labor, your health care provider will send you home with instructions about when to return to the hospital. You may continue to have Braxton Hicks contractions until you go into true labor. Follow these instructions at  home:  Take over-the-counter and prescription medicines only as told by your health care provider. If Braxton Hicks contractions are making you uncomfortable: Change your position from lying down or resting to walking, or change from walking to resting. Sit and rest in a tub of warm water. Drink enough fluid to keep your urine pale yellow. Dehydration may cause these contractions. Do slow and deep breathing several times an hour. Keep all follow-up visits. This is important. Contact a health care provider if: You have a fever. You have continuous pain in your abdomen. Your contractions become stronger, more regular, and closer together. You pass blood-tinged mucus. Get help right away if: You have fluid leaking or gushing from your vagina. You have bright red blood coming from your vagina. Your baby is not moving inside you as much as it used to. Summary You may have practice contractions called Braxton Hicks contractions. These false labor contractions are sometimes confused with true labor. Braxton Hicks contractions are usually shorter, weaker, farther apart, and less regular than true labor contractions. True labor contractions usually become stronger, more regular, and more frequent. Manage discomfort from [facility] contractions by changing position, resting in a warm bath, practicing deep breathing, and drinking plenty of water. Keep all follow-up visits. Contact your health care provider if your contractions become stronger, more regular, and closer together. This information is not intended to replace advice given to you by your health care provider. Make sure you discuss any questions you have with your health care provider. Document Revised: 07/08/2020 Document Reviewed: 07/08/2020 Elsevier Patient Education  2022 [employer].

## 2021-07-16 NOTE — Progress Notes (Signed)
Subjective:  Allison Williamson is a 20 y.o. G2P0010 at [redacted]w[redacted]d being seen today for ongoing prenatal care.  She is currently monitored for the following issues for this low-risk pregnancy and has Encounter for supervision of normal pregnancy, antepartum; Genetic carrier; Trichomonal vaginitis during pregnancy in third trimester; Heartburn during pregnancy, third trimester; and Syncope on their problem list.  Patient reports shortness of breath, dizziness.  Contractions: Not present. Vag. Bleeding: None.  Movement: Present. Denies leaking of fluid.   The following portions of the patient's history were reviewed and updated as appropriate: allergies, current medications, past family history, past medical history, past social history, past surgical history and problem list. Problem list updated.  Objective:   Vitals:   07/16/21 0856  BP: 111/71  Pulse: (!) 152  Weight: 150 lb (68 kg)    Fetal Status: Fetal Heart Rate (bpm): 155 Fundal Height: 37 cm Movement: Present     General:  Alert, oriented and cooperative. Patient is in no acute distress.  Skin: Skin is warm and dry. No rash noted.   Cardiovascular: Normal heart rate noted  Respiratory: Normal respiratory effort, no problems with respiration noted  Abdomen: Soft, gravid, appropriate for gestational age. Pain/Pressure: Present     Pelvic: Vag. Bleeding: None     Cervical exam performed Dilation: 1 Effacement (%): Thick Station: Ballotable  Extremities: Normal range of motion.  Edema: None  Mental Status: Normal mood and affect. Normal behavior. Normal judgment and thought content.   Urinalysis:      Assessment and Plan:  Pregnancy: G2P0010 at [redacted]w[redacted]d  1. Supervision of other normal pregnancy, antepartum -cervical check per pt request  2. Vasovagal syncope -currently working with OB cardio, has appointment today to discuss results of testing -repeat manual HR 90, pt states increased HR is only when standing, which is also when  she feels symptoms -pt states has compression socks but is not wearing them today -pt states she has had these symptoms long standing during pregnancy and has a plan for management with OB cardio and symptoms have not worsened or changed -pt advised if symptoms do worsen or change that she should present to ED for further evaluation  3. Genetic carrier -increased risk SMA, GC ordered previously  4. Trichomonal vaginitis during pregnancy in third trimester -TOC 06/20/2021 - negative  5. [redacted] weeks gestation of pregnancy  Term labor symptoms and general obstetric precautions including but not limited to vaginal bleeding, contractions, leaking of fluid and fetal movement were reviewed in detail with the patient. I discussed the assessment and treatment plan with the patient. The patient was provided an opportunity to ask questions and all were answered. The patient agreed with the plan and demonstrated an understanding of the instructions. The patient was advised to call back or seek an in-person office evaluation/go to MAU at [facility] for any urgent or concerning symptoms. Please refer to After Visit Summary for other counseling recommendations.  Return in about 1 week (around 07/23/2021) for in-person LOB/APP OK.   Delyla Sandeen, Odie Sera, NP

## 2021-07-16 NOTE — Progress Notes (Signed)
ROB c/o dizziness, SOB.

## 2021-07-23 ENCOUNTER — Encounter: Payer: Self-pay | Admitting: Obstetrics

## 2021-07-23 ENCOUNTER — Ambulatory Visit (INDEPENDENT_AMBULATORY_CARE_PROVIDER_SITE_OTHER): Payer: Federal, State, Local not specified - PPO | Admitting: Obstetrics

## 2021-07-23 ENCOUNTER — Other Ambulatory Visit: Payer: Self-pay

## 2021-07-23 VITALS — BP 113/77 | HR 126 | Wt 154.8 lb

## 2021-07-23 DIAGNOSIS — Z3480 Encounter for supervision of other normal pregnancy, unspecified trimester: Secondary | ICD-10-CM

## 2021-07-23 MED ORDER — IBUPROFEN 800 MG PO TABS: 800.0000 mg | ORAL_TABLET | Freq: Three times a day (TID) | ORAL | 5 refills | Status: DC | PRN

## 2021-07-23 MED ORDER — PNV-DHA+DOCUSATE 27-1.25-300 MG PO CAPS: 1.0000 | ORAL_CAPSULE | Freq: Every day | ORAL | 4 refills | Status: DC

## 2021-07-23 MED ORDER — NORETHINDRONE 0.35 MG PO TABS: 1.0000 | ORAL_TABLET | Freq: Every day | ORAL | 11 refills | Status: DC

## 2021-07-23 MED ORDER — PRENATAL GUMMIES 0.18-25 MG PO CHEW: 3.0000 | CHEWABLE_TABLET | Freq: Every day | ORAL | 11 refills | Status: AC

## 2021-07-23 NOTE — Progress Notes (Signed)
ROB 38.[redacted] wks GA No complaints Desire for SVE  Patient was assessed and managed by nursing staff during this encounter. I have reviewed the chart and agree with the documentation and plan. I have also made any necessary editorial changes.  Coral Ceo, MD 07/23/2021 11:18 PM

## 2021-07-23 NOTE — Progress Notes (Signed)
Subjective:  Allison Williamson is a 20 y.o. G2P0010 at [redacted]w[redacted]d being seen today for ongoing prenatal care.  She is currently monitored for the following issues for this low-risk pregnancy and has Encounter for supervision of normal pregnancy, antepartum; Genetic carrier; Trichomonal vaginitis during pregnancy in third trimester; Heartburn during pregnancy, third trimester; and Syncope on their problem list.  Patient reports backache.  Contractions: Irritability. Vag. Bleeding: None.  Movement: Present. Denies leaking of fluid.   The following portions of the patient's history were reviewed and updated as appropriate: allergies, current medications, past family history, past medical history, past social history, past surgical history and problem list. Problem list updated.  Objective:   Vitals:   07/23/21 0842  BP: 113/77  Pulse: (!) 126  Weight: 154 lb 12.8 oz (70.2 kg)    Fetal Status:     Movement: Present     General:  Alert, oriented and cooperative. Patient is in no acute distress.  Skin: Skin is warm and dry. No rash noted.   Cardiovascular: Normal heart rate noted  Respiratory: Normal respiratory effort, no problems with respiration noted  Abdomen: Soft, gravid, appropriate for gestational age. Pain/Pressure: Present   FHR = 140 bpm  Pelvic:  Cervical exam performed      FT / 50% / -3 / Vtx  Extremities: Normal range of motion.  Edema: None  Mental Status: Normal mood and affect. Normal behavior. Normal judgment and thought content.   Urinalysis:      Assessment and Plan:  Pregnancy: G2P0010 at [redacted]w[redacted]d  There are no diagnoses linked to this encounter. Term labor symptoms and general obstetric precautions including but not limited to vaginal bleeding, contractions, leaking of fluid and fetal movement were reviewed in detail with the patient. Please refer to After Visit Summary for other counseling recommendations.   Return in about 1 week (around 07/30/2021).   Brock Bad, MD  07/23/21

## 2021-07-28 ENCOUNTER — Encounter: Payer: Self-pay | Admitting: Obstetrics and Gynecology

## 2021-07-28 ENCOUNTER — Telehealth: Payer: Self-pay | Admitting: Obstetrics and Gynecology

## 2021-07-28 ENCOUNTER — Other Ambulatory Visit: Payer: Self-pay

## 2021-07-28 ENCOUNTER — Ambulatory Visit (INDEPENDENT_AMBULATORY_CARE_PROVIDER_SITE_OTHER): Payer: Federal, State, Local not specified - PPO | Admitting: Obstetrics and Gynecology

## 2021-07-28 VITALS — BP 117/78 | HR 109 | Wt 150.1 lb

## 2021-07-28 DIAGNOSIS — Z3480 Encounter for supervision of other normal pregnancy, unspecified trimester: Secondary | ICD-10-CM

## 2021-07-28 DIAGNOSIS — Z0371 Encounter for suspected problem with amniotic cavity and membrane ruled out: Secondary | ICD-10-CM | POA: Insufficient documentation

## 2021-07-28 DIAGNOSIS — Z3A39 39 weeks gestation of pregnancy: Secondary | ICD-10-CM

## 2021-07-28 DIAGNOSIS — O26893 Other specified pregnancy related conditions, third trimester: Secondary | ICD-10-CM | POA: Diagnosis not present

## 2021-07-28 NOTE — Telephone Encounter (Signed)
Patient left voicemail at 8:41am this morning that she is leaking fluid, returned call to patient.    She did not describe a gush but light leaking once this morning.    Patient scheduled to come into office now to be checked.

## 2021-07-28 NOTE — Progress Notes (Signed)
Pt presents for ROB c/o LOF this morning.  +FM, denies VB

## 2021-07-28 NOTE — Progress Notes (Signed)
   PRENATAL VISIT NOTE  Subjective:  Allison Williamson is a 20 y.o. G2P0010 at [redacted]w[redacted]d being seen today for ongoing prenatal care.  She is currently monitored for the following issues for this low-risk pregnancy and has Encounter for supervision of normal pregnancy, antepartum; Genetic carrier; Trichomonal vaginitis during pregnancy in third trimester; Heartburn during pregnancy, third trimester; Syncope; [redacted] weeks gestation of pregnancy; and No leakage of amniotic fluid into vagina on their problem list.  Patient doing well with no acute concerns today. She reports  possible leakage of fluid .  Contractions: Irritability. Vag. Bleeding: None.  Movement: Present.  The following portions of the patient's history were reviewed and updated as appropriate: allergies, current medications, past family history, past medical history, past social history, past surgical history and problem list. Problem list updated.  Objective:   Vitals:   07/28/21 1033  BP: 117/78  Pulse: (!) 109  Weight: 150 lb 1.6 oz (68.1 kg)    Fetal Status: Fetal Heart Rate (bpm): 131 Fundal Height: 39 cm Movement: Present     General:  Alert, oriented and cooperative. Patient is in no acute distress.  Skin: Skin is warm and dry. No rash noted.   Cardiovascular: Normal heart rate noted  Respiratory: Normal respiratory effort, no problems with respiration noted  Abdomen: Soft, gravid, appropriate for gestational age.  Pain/Pressure: Present     Pelvic: Cervical exam performed Dilation: Fingertip Effacement (%): Thick Station: Ballotable  Extremities: Normal range of motion.  Edema: None  Mental Status:  Normal mood and affect. Normal behavior. Normal judgment and thought content.  Fern negative, pooling negative, nitrazine negative, Mucus c/w mucus plug noted Assessment and Plan:  Pregnancy: G2P0010 at [redacted]w[redacted]d  1. Supervision of other normal pregnancy, antepartum IOL scheduled for 08/05/21, if cannot be scheduled NST in  1 week, possible IOL 11/26 or later if cannot schedule  2. [redacted] weeks gestation of pregnancy   3. No leakage of amniotic fluid into vagina SVE: negative for ROM  Term labor symptoms and general obstetric precautions including but not limited to vaginal bleeding, contractions, leaking of fluid and fetal movement were reviewed in detail with the patient.  Please refer to After Visit Summary for other counseling recommendations.   No follow-ups on file.   Mariel Aloe, MD [facility] Attending [facility]

## 2021-07-29 ENCOUNTER — Other Ambulatory Visit: Payer: Self-pay | Admitting: Advanced Practice Midwife

## 2021-07-30 ENCOUNTER — Encounter: Payer: Federal, State, Local not specified - PPO | Admitting: Obstetrics and Gynecology

## 2021-07-30 DIAGNOSIS — F411 Generalized anxiety disorder: Secondary | ICD-10-CM | POA: Diagnosis not present

## 2021-08-03 ENCOUNTER — Inpatient Hospital Stay (HOSPITAL_COMMUNITY): Payer: Federal, State, Local not specified - PPO | Admitting: Anesthesiology

## 2021-08-03 ENCOUNTER — Other Ambulatory Visit: Payer: Self-pay

## 2021-08-03 ENCOUNTER — Encounter (HOSPITAL_COMMUNITY): Payer: Self-pay | Admitting: Family Medicine

## 2021-08-03 ENCOUNTER — Inpatient Hospital Stay (HOSPITAL_COMMUNITY)
Admission: AD | Admit: 2021-08-03 | Discharge: 2021-08-05 | DRG: 807 | Disposition: A | Discharge status: Home / Self Care | Payer: Federal, State, Local not specified - PPO | Attending: Obstetrics & Gynecology | Admitting: Obstetrics & Gynecology

## 2021-08-03 DIAGNOSIS — O1414 Severe pre-eclampsia complicating childbirth: Secondary | ICD-10-CM | POA: Diagnosis not present

## 2021-08-03 DIAGNOSIS — Z3A39 39 weeks gestation of pregnancy: Secondary | ICD-10-CM | POA: Diagnosis not present

## 2021-08-03 DIAGNOSIS — A5901 Trichomonal vulvovaginitis: Secondary | ICD-10-CM | POA: Diagnosis present

## 2021-08-03 DIAGNOSIS — O26893 Other specified pregnancy related conditions, third trimester: Secondary | ICD-10-CM | POA: Diagnosis not present

## 2021-08-03 DIAGNOSIS — O480 Post-term pregnancy: Secondary | ICD-10-CM | POA: Diagnosis present

## 2021-08-03 DIAGNOSIS — Z148 Genetic carrier of other disease: Secondary | ICD-10-CM

## 2021-08-03 DIAGNOSIS — O1410 Severe pre-eclampsia, unspecified trimester: Secondary | ICD-10-CM

## 2021-08-03 DIAGNOSIS — Z20822 Contact with and (suspected) exposure to covid-19: Secondary | ICD-10-CM | POA: Diagnosis present

## 2021-08-03 DIAGNOSIS — Z3490 Encounter for supervision of normal pregnancy, unspecified, unspecified trimester: Secondary | ICD-10-CM

## 2021-08-03 DIAGNOSIS — O23593 Infection of other part of genital tract in pregnancy, third trimester: Secondary | ICD-10-CM | POA: Diagnosis present

## 2021-08-03 LAB — CBC
HCT: 43 % (ref 36.0–46.0)
HCT: 39.4 % (ref 36.0–46.0)
Hemoglobin: 14.4 g/dL (ref 12.0–15.0)
Hemoglobin: 13.4 g/dL (ref 12.0–15.0)
MCH: 31.9 pg (ref 26.0–34.0)
MCH: 31.8 pg (ref 26.0–34.0)
MCHC: 33.5 g/dL (ref 30.0–36.0)
MCHC: 34 g/dL (ref 30.0–36.0)
MCV: 95.3 fL (ref 80.0–100.0)
MCV: 93.4 fL (ref 80.0–100.0)
Platelets: 232 10*3/uL (ref 150–400)
Platelets: 207 10*3/uL (ref 150–400)
RBC: 4.51 MIL/uL (ref 3.87–5.11)
RBC: 4.22 MIL/uL (ref 3.87–5.11)
RDW: 13 % (ref 11.5–15.5)
RDW: 13.1 % (ref 11.5–15.5)
WBC: 5.8 10*3/uL (ref 4.0–10.5)
WBC: 11.1 10*3/uL — ABNORMAL HIGH (ref 4.0–10.5)
nRBC: 0 % (ref 0.0–0.2)
nRBC: 0 % (ref 0.0–0.2)

## 2021-08-03 LAB — COMPREHENSIVE METABOLIC PANEL
ALT: 13 U/L (ref 0–44)
AST: 22 U/L (ref 15–41)
Albumin: 2.7 g/dL — ABNORMAL LOW (ref 3.5–5.0)
Alkaline Phosphatase: 295 U/L — ABNORMAL HIGH (ref 38–126)
Anion gap: 9 (ref 5–15)
BUN: 7 mg/dL (ref 6–20)
CO2: 21 mmol/L — ABNORMAL LOW (ref 22–32)
Calcium: 9.7 mg/dL (ref 8.9–10.3)
Chloride: 108 mmol/L (ref 98–111)
Creatinine, Ser: 0.85 mg/dL (ref 0.44–1.00)
GFR, Estimated: >60 mL/min (ref >60)
Glucose, Bld: 78 mg/dL (ref 70–99)
Potassium: 3.5 mmol/L (ref 3.5–5.1)
Sodium: 138 mmol/L (ref 135–145)
Total Bilirubin: 1 mg/dL (ref 0.3–1.2)
Total Protein: 6.3 g/dL — ABNORMAL LOW (ref 6.5–8.1)

## 2021-08-03 LAB — RESP PANEL BY RT-PCR (FLU A&B, COVID) ARPGX2
Influenza A by PCR: NEGATIVE
Influenza B by PCR: NEGATIVE
SARS Coronavirus 2 by RT PCR: NEGATIVE

## 2021-08-03 LAB — TYPE AND SCREEN
ABO/RH(D): O POS
Antibody Screen: NEGATIVE

## 2021-08-03 LAB — RPR: RPR Ser Ql: NONREACTIVE

## 2021-08-03 MED ORDER — LABETALOL HCL 5 MG/ML IV SOLN
40.0000 mg | INTRAVENOUS | Status: DC | PRN
  Administered 2021-08-03: 40 mg via INTRAVENOUS
  Filled 2021-08-03: qty 8

## 2021-08-03 MED ORDER — ACETAMINOPHEN 500 MG PO TABS
1000.0000 mg | ORAL_TABLET | Freq: Four times a day (QID) | ORAL | Status: DC | PRN
  Administered 2021-08-03: 1000 mg via ORAL
  Filled 2021-08-03: qty 2

## 2021-08-03 MED ORDER — OXYCODONE-ACETAMINOPHEN 5-325 MG PO TABS: 1.0000 | ORAL_TABLET | ORAL | Status: DC | PRN

## 2021-08-03 MED ORDER — PRENATAL MULTIVITAMIN CH
1.0000 | ORAL_TABLET | Freq: Every day | ORAL | Status: DC
  Administered 2021-08-04: 1 via ORAL
  Filled 2021-08-03: qty 1

## 2021-08-03 MED ORDER — EPHEDRINE 5 MG/ML INJ: 10.0000 mg | INTRAVENOUS | Status: DC | PRN

## 2021-08-03 MED ORDER — BENZOCAINE-MENTHOL 20-0.5 % EX AERO
1.0000 "application " | INHALATION_SPRAY | CUTANEOUS | Status: DC | PRN
  Filled 2021-08-03: qty 56

## 2021-08-03 MED ORDER — TERBUTALINE SULFATE 1 MG/ML IJ SOLN: 0.2500 mg | Freq: Once | INTRAMUSCULAR | Status: DC | PRN

## 2021-08-03 MED ORDER — DIPHENHYDRAMINE HCL 25 MG PO CAPS: 25.0000 mg | ORAL_CAPSULE | Freq: Four times a day (QID) | ORAL | Status: DC | PRN

## 2021-08-03 MED ORDER — ONDANSETRON HCL 4 MG/2ML IJ SOLN: 4.0000 mg | Freq: Four times a day (QID) | INTRAMUSCULAR | Status: DC | PRN

## 2021-08-03 MED ORDER — OXYTOCIN-SODIUM CHLORIDE 30-0.9 UT/500ML-% IV SOLN
2.5000 [IU]/h | INTRAVENOUS | Status: DC
  Filled 2021-08-03: qty 500

## 2021-08-03 MED ORDER — FENTANYL CITRATE (PF) 100 MCG/2ML IJ SOLN: 50.0000 ug | INTRAMUSCULAR | Status: DC | PRN

## 2021-08-03 MED ORDER — SENNOSIDES-DOCUSATE SODIUM 8.6-50 MG PO TABS
2.0000 | ORAL_TABLET | Freq: Every day | ORAL | Status: DC
  Administered 2021-08-04 – 2021-08-05 (×2): 2 via ORAL
  Filled 2021-08-03 (×2): qty 2

## 2021-08-03 MED ORDER — WITCH HAZEL-GLYCERIN EX PADS: 1.0000 "application " | MEDICATED_PAD | CUTANEOUS | Status: DC | PRN

## 2021-08-03 MED ORDER — DIPHENHYDRAMINE HCL 50 MG/ML IJ SOLN: 12.5000 mg | INTRAMUSCULAR | Status: DC | PRN

## 2021-08-03 MED ORDER — TETANUS-DIPHTH-ACELL PERTUSSIS 5-2.5-18.5 LF-MCG/0.5 IM SUSY: 0.5000 mL | PREFILLED_SYRINGE | Freq: Once | INTRAMUSCULAR | Status: DC

## 2021-08-03 MED ORDER — LABETALOL HCL 5 MG/ML IV SOLN: 80.0000 mg | INTRAVENOUS | Status: DC | PRN

## 2021-08-03 MED ORDER — MAGNESIUM SULFATE 40 GM/1000ML IV SOLN
2.0000 g/h | INTRAVENOUS | Status: AC
  Administered 2021-08-03 – 2021-08-04 (×2): 2 g/h via INTRAVENOUS
  Filled 2021-08-03 (×2): qty 1000

## 2021-08-03 MED ORDER — LACTATED RINGERS IV SOLN: 500.0000 mL | Freq: Once | INTRAVENOUS | Status: DC

## 2021-08-03 MED ORDER — MISOPROSTOL 25 MCG QUARTER TABLET: 25.0000 ug | ORAL_TABLET | ORAL | Status: DC | PRN

## 2021-08-03 MED ORDER — LIDOCAINE HCL (PF) 1 % IJ SOLN: 30.0000 mL | INTRAMUSCULAR | Status: DC | PRN

## 2021-08-03 MED ORDER — ONDANSETRON HCL 4 MG/2ML IJ SOLN
4.0000 mg | Freq: Four times a day (QID) | INTRAMUSCULAR | Status: DC | PRN
  Administered 2021-08-03: 4 mg via INTRAVENOUS
  Filled 2021-08-03: qty 2

## 2021-08-03 MED ORDER — HYDRALAZINE HCL 20 MG/ML IJ SOLN: 10.0000 mg | INTRAMUSCULAR | Status: DC | PRN

## 2021-08-03 MED ORDER — OXYCODONE-ACETAMINOPHEN 5-325 MG PO TABS: 2.0000 | ORAL_TABLET | ORAL | Status: DC | PRN

## 2021-08-03 MED ORDER — ONDANSETRON HCL 4 MG/2ML IJ SOLN: 4.0000 mg | INTRAMUSCULAR | Status: DC | PRN

## 2021-08-03 MED ORDER — ZOLPIDEM TARTRATE 5 MG PO TABS: 5.0000 mg | ORAL_TABLET | Freq: Every evening | ORAL | Status: DC | PRN

## 2021-08-03 MED ORDER — OXYTOCIN-SODIUM CHLORIDE 30-0.9 UT/500ML-% IV SOLN
1.0000 m[IU]/min | INTRAVENOUS | Status: DC
Start: 2021-08-03 — End: 2021-08-03
  Administered 2021-08-03: 2 m[IU]/min via INTRAVENOUS

## 2021-08-03 MED ORDER — OXYTOCIN-SODIUM CHLORIDE 30-0.9 UT/500ML-% IV SOLN: 2.5000 [IU]/h | INTRAVENOUS | Status: DC

## 2021-08-03 MED ORDER — LABETALOL HCL 5 MG/ML IV SOLN
20.0000 mg | INTRAVENOUS | Status: DC | PRN
  Administered 2021-08-03: 20 mg via INTRAVENOUS

## 2021-08-03 MED ORDER — SIMETHICONE 80 MG PO CHEW: 80.0000 mg | CHEWABLE_TABLET | ORAL | Status: DC | PRN

## 2021-08-03 MED ORDER — PHENYLEPHRINE 40 MCG/ML (10ML) SYRINGE FOR IV PUSH (FOR BLOOD PRESSURE SUPPORT): 80.0000 ug | PREFILLED_SYRINGE | INTRAVENOUS | Status: DC | PRN

## 2021-08-03 MED ORDER — MAGNESIUM SULFATE BOLUS VIA INFUSION
6.0000 g | Freq: Once | INTRAVENOUS | Status: AC
  Administered 2021-08-03: 6 g via INTRAVENOUS
  Filled 2021-08-03: qty 1000

## 2021-08-03 MED ORDER — SOD CITRATE-CITRIC ACID 500-334 MG/5ML PO SOLN: 30.0000 mL | ORAL | Status: DC | PRN

## 2021-08-03 MED ORDER — HYDROXYZINE HCL 50 MG PO TABS: 50.0000 mg | ORAL_TABLET | Freq: Four times a day (QID) | ORAL | Status: DC | PRN

## 2021-08-03 MED ORDER — FENTANYL-BUPIVACAINE-NACL 0.5-0.125-0.9 MG/250ML-% EP SOLN: 12.0000 mL/h | EPIDURAL | Status: DC | PRN

## 2021-08-03 MED ORDER — COCONUT OIL OIL: 1.0000 "application " | TOPICAL_OIL | Status: DC | PRN

## 2021-08-03 MED ORDER — NIFEDIPINE ER OSMOTIC RELEASE 30 MG PO TB24
30.0000 mg | ORAL_TABLET | Freq: Every day | ORAL | Status: DC
  Administered 2021-08-03 – 2021-08-05 (×3): 30 mg via ORAL
  Filled 2021-08-03 (×3): qty 1

## 2021-08-03 MED ORDER — OXYTOCIN-SODIUM CHLORIDE 30-0.9 UT/500ML-% IV SOLN: 1.0000 m[IU]/min | INTRAVENOUS | Status: DC

## 2021-08-03 MED ORDER — OXYTOCIN BOLUS FROM INFUSION: 333.0000 mL | Freq: Once | INTRAVENOUS | Status: DC

## 2021-08-03 MED ORDER — LACTATED RINGERS IV SOLN: 500.0000 mL | INTRAVENOUS | Status: DC | PRN

## 2021-08-03 MED ORDER — PHENYLEPHRINE 40 MCG/ML (10ML) SYRINGE FOR IV PUSH (FOR BLOOD PRESSURE SUPPORT)
80.0000 ug | PREFILLED_SYRINGE | INTRAVENOUS | Status: DC | PRN
  Filled 2021-08-03: qty 10

## 2021-08-03 MED ORDER — ACETAMINOPHEN 325 MG PO TABS: 650.0000 mg | ORAL_TABLET | ORAL | Status: DC | PRN

## 2021-08-03 MED ORDER — DIBUCAINE (PERIANAL) 1 % EX OINT: 1.0000 "application " | TOPICAL_OINTMENT | CUTANEOUS | Status: DC | PRN

## 2021-08-03 MED ORDER — LACTATED RINGERS IV SOLN: INTRAVENOUS | Status: DC

## 2021-08-03 MED ORDER — IBUPROFEN 600 MG PO TABS
600.0000 mg | ORAL_TABLET | Freq: Four times a day (QID) | ORAL | Status: DC
  Administered 2021-08-04 – 2021-08-05 (×6): 600 mg via ORAL
  Filled 2021-08-03 (×6): qty 1

## 2021-08-03 MED ORDER — SOD CITRATE-CITRIC ACID 500-334 MG/5ML PO SOLN
30.0000 mL | ORAL | Status: DC | PRN
  Administered 2021-08-03: 30 mL via ORAL
  Filled 2021-08-03: qty 30

## 2021-08-03 MED ORDER — FENTANYL-BUPIVACAINE-NACL 0.5-0.125-0.9 MG/250ML-% EP SOLN
12.0000 mL/h | EPIDURAL | Status: DC | PRN
  Administered 2021-08-03: 12 mL/h via EPIDURAL
  Filled 2021-08-03: qty 250

## 2021-08-03 MED ORDER — OXYTOCIN BOLUS FROM INFUSION
333.0000 mL | Freq: Once | INTRAVENOUS | Status: AC
  Administered 2021-08-03: 333 mL via INTRAVENOUS

## 2021-08-03 MED ORDER — LIDOCAINE HCL (PF) 1 % IJ SOLN
INTRAMUSCULAR | Status: DC | PRN
  Administered 2021-08-03: 6 mL via EPIDURAL

## 2021-08-03 MED ORDER — ONDANSETRON HCL 4 MG PO TABS: 4.0000 mg | ORAL_TABLET | ORAL | Status: DC | PRN

## 2021-08-03 MED ORDER — OXYCODONE HCL 5 MG PO TABS: 5.0000 mg | ORAL_TABLET | ORAL | Status: DC | PRN

## 2021-08-03 NOTE — H&P (Signed)
OBSTETRIC ADMISSION HISTORY AND PHYSICAL  Dayshia Ballinas is a 20 y.o. female G2P0010 with IUP at 62w6dby LMP presenting for SOL. She started having contractions earlier this evening, feeling it more in her back.  She reports +FMs, No LOF, no VB, no blurry vision, headaches or peripheral edema, and RUQ pain.  She plans on breast feeding. She request POPs for birth control. [facility]    Dating: By LMP --->  Estimated Date of Delivery: 08/04/21  Prenatal History/Complications:  --Positive trich with negative TOC in 06/2021  --SMA increased carrier risk  --History of syncope in earlier pregnancy   Past Medical History: Past Medical History:  Diagnosis Date   Medical history non-contributory     Past Surgical History: Past Surgical History:  Procedure Laterality Date   NO PAST SURGERIES      Obstetrical History: OB History     Gravida  2   Para      Term      Preterm      AB  1   Living         SAB  1   IAB      Ectopic      Multiple      Live Births              Social History Social History   Socioeconomic History   Marital status: Single    Spouse name: Not on file   Number of children: Not on file   Years of education: Not on file   Highest education level: Not on file  Occupational History   Not on file  Tobacco Use   Smoking status: Never   Smokeless tobacco: Never  Vaping Use   Vaping Use: Never used  Substance and Sexual Activity   Alcohol use: Never   Drug use: Never   Sexual activity: Yes    Partners: Male    Birth control/protection: None  Other Topics Concern   Not on file  Social History Narrative   Not on file   Social Determinants of Health   Financial Resource Strain: Not on file  Food Insecurity: Not on file  Transportation Needs: Not on file  Physical Activity: Not on file  Stress: Not on file  Social Connections: Not on file    Family History: Family History  Problem  Relation Age of Onset   Diabetes Mother    Hypertension Mother     Allergies: No Known Allergies  Medications Prior to Admission  Medication Sig Dispense Refill Last Dose   famotidine (PEPCID) 20 MG tablet Take 1 tablet (20 mg total) by mouth 2 (two) times daily. 60 tablet 2 08/02/2021   ferrous sulfate 325 (65 FE) MG tablet Take 325 mg by mouth daily with breakfast.   08/02/2021   Blood Pressure Monitoring (BLOOD PRESSURE KIT) DEVI 1 kit by Does not apply route once a week. 1 each 0    calcium carbonate (TUMS - DOSED IN MG ELEMENTAL CALCIUM) 500 MG chewable tablet Chew 1 tablet by mouth daily. (Patient not taking: Reported on 07/28/2021)      Prenatal MV & Min w/FA-DHA (PRENATAL GUMMIES) 0.18-25 MG CHEW Chew 3 capsules by mouth daily before breakfast. 90 tablet 11      Review of Systems   All systems reviewed and negative except as stated in HPI  Pulse 90, temperature 98 F (36.7 C), temperature source Oral, resp. rate 18, height 5' 2"  (  1.575 m), weight 67.9 kg, last menstrual period 10/28/2020, SpO2 99 %, unknown if currently breastfeeding. General appearance: alert, cooperative, and no distress, some discomfort with contractions  Lungs: cNormal WOB  Heart: regular rate and rhythm Abdomen: soft, non-tender; gravid uterus. Leopold's with fetal positioning feeling more OP Extremities: Homans sign is negative, no sign of DVT Presentation: cephalic Fetal monitoringBaseline: 130 bpm, Variability: Good {> 6 bpm), Accelerations: Reactive, and Decelerations: Absent Uterine activity every 2-3 min  Dilation: 4.5 Effacement (%): 90 Station: -1 Exam by:: Apolinar Junes RN   Prenatal labs: ABO, Rh: --/--/O POS (09/28 1022) Antibody: NEG (09/28 1022) Rubella: 1.21 (05/05 1401) RPR: Non Reactive (08/24 1023)  HBsAg: Negative (05/05 1401)  HIV: Non Reactive (08/24 1023)  GBS: Negative/-- (10/26 0933)  2 hr Glucola passed Genetic screening  LR NIPS, SMA carrier risk  Anatomy US Normal    Prenatal Transfer Tool  Maternal Diabetes: No Genetic Screening: Normal Maternal Ultrasounds/Referrals: Normal Fetal Ultrasounds or other Referrals:  None Maternal Substance Abuse:  No Significant Maternal Medications:  None Significant Maternal Lab Results: Group B Strep negative  No results found for this or any previous visit (from the past 24 hour(s)).  Patient Active Problem List   Diagnosis Date Noted   [redacted] weeks gestation of pregnancy 07/28/2021   No leakage of amniotic fluid into vagina 07/28/2021   Syncope 06/11/2021   Heartburn during pregnancy, third trimester 05/21/2021   Trichomonal vaginitis during pregnancy in third trimester 05/16/2021   Genetic carrier 04/23/2021   Encounter for supervision of normal pregnancy, antepartum 01/09/2021    Assessment/Plan:  Lindsy Cerullo is a 19 y.o. G2P0010 at 52w6dhere for SOL.  #Labor: Appears to hopefully be transitioning into active labor. Plan for expectant management for now. Initially planned to labor in the water, then shortly after get epidural. However after further discussion, she is hoping just to be up and around with her labor prior to epidural placement. Discussed option of being in the shower, this way we can additionally monitor FHT with recent minimal variability. Doula at bedside.  #Pain: PRN, plans for epidural  #FWB: Cat I-II, initially minimal variability however appears to be improving  #ID: GBS negative  #MOF: Breastfeeding  #MOC: POPs  #Circ: NA (female)   SPatriciaann Clan DO  08/03/2021, 1:12 AM

## 2021-08-03 NOTE — Progress Notes (Signed)
Allison Williamson is a 20 y.o. G2P0010 at [redacted]w[redacted]d admitted for SOL  Subjective: Reports feeling more pressure.   Objective: BP 136/83   Pulse 69   Temp 98.5 F (36.9 C)   Resp 16   Ht 5\' 2"  (1.575 m)   Wt 67.9 kg   LMP 10/28/2020   SpO2 99%   BMI 27.38 kg/m  No intake/output data recorded. Total I/O In: 120 [P.O.:120] Out: -   FHT:  FHR: 130 bpm, variability: moderate,  accelerations:  Present,  decelerations:  Absent UC:   regular, every 1-3 minutes SVE:   Dilation: 5 Effacement (%): 70 Station: -2 Exam by:: [email or web address], Student Midwife  Labs: Lab Results  Component Value Date   WBC 5.8 08/03/2021   HGB 14.4 08/03/2021   HCT 43.0 08/03/2021   MCV 95.3 08/03/2021   PLT 232 08/03/2021    Assessment / Plan: Here for SOL, now s/p AROM. Feeling more pressure. Cervic 5cm. Will continue uptitrating pitocin as tolerated. Will try positioning to help descent of fetal head (still at -2)  Reassess in ~4 hrs from last check (~1500) or prior to that if tracing or clinical picture warrants reassessment.  08/05/2021 08/03/2021, 12:56 PM

## 2021-08-03 NOTE — MAU Note (Signed)
FHR remains difficult to maintain due to fetal position and maternal need to sit forward due to back labor. Traces maternal HR immediately returns to baseline with cardio adjustment

## 2021-08-03 NOTE — Discharge Summary (Signed)
Postpartum Discharge Summary  Date of Service updated 08/05/21     Patient Name: Allison Williamson DOB: [birth year] MRN: [id number]  Date of admission: 08/03/2021 Delivery date:08/03/2021  Delivering provider: Renard Matter  Date of discharge: 08/05/2021  Admitting diagnosis: Indication for care in labor and delivery, antepartum [O75.9] Post term pregnancy over 40 weeks [O48.0] Intrauterine pregnancy: [redacted]w[redacted]d    Secondary diagnosis:  Principal Problem:   Indication for care in labor and delivery, antepartum Active Problems:   Encounter for supervision of normal pregnancy, antepartum   Genetic carrier   Trichomonal vaginitis during pregnancy in third trimester   Post term pregnancy over 40 weeks   Severe preeclampsia  Additional problems: None    Discharge diagnosis: Term Pregnancy Delivered and Preeclampsia (severe)                                              Post partum procedures: Magnesium x 24 hours Augmentation: AROM and Pitocin Complications: None  Hospital course: Onset of Labor With Vaginal Delivery      20y.o. yo G2P0010 at 334w6das admitted in Latent Labor on 08/03/2021. Patient had an uncomplicated labor course as follows:  Membrane Rupture Time/Date: 10:08 AM ,08/03/2021   Delivery Method:Vaginal, Spontaneous  Episiotomy: None  Lacerations:  1st degree;Periurethral  Patient had elevated blood pressures immediately post partum while still on L&D and had headaches with a severe range blood pressure. She was started on Labetalol protocol, procardia 3024mand IV Mg x 24 hours. BP well controlled.  She is ambulating, tolerating a regular diet, passing flatus, and urinating well. Patient is discharged home in stable condition on 08/05/21.  Newborn Data: Birth date:08/03/2021  Birth time:5:17 PM  Gender:Female  Living status:Living  Apgars:7 ,9  Weight:3572 g   Magnesium Sulfate received: Yes: Seizure prophylaxis BMZ received:  No Rhophylac:N/A MMR:N/A T-DaP:Given prenatally Flu: N/A Transfusion:No  Physical exam  Vitals:   08/04/21 1618 08/04/21 1947 08/05/21 0005 08/05/21 0327  BP: 113/67 121/65 111/67 120/70  Pulse: 84 83 76 72  Resp: _0 Temp: (!) 97.5 F (36.4 C) 98.2 F (36.8 C) 97.7 F (36.5 C) (!) 97.4 F (36.3 C)  TempSrc: Oral Oral Oral Oral  SpO2: 100%  100%   Weight:      Height:       General: alert Lochia: appropriate Uterine Fundus: firm Incision: N/A DVT Evaluation: No evidence of DVT seen on physical exam. Labs: Lab Results  Component Value Date   WBC 13.3 (H) 08/04/2021   HGB 11.6 (L) 08/04/2021   HCT 33.7 (L) 08/04/2021   MCV 94.1 08/04/2021   PLT 194 08/04/2021   CMP Latest Ref Rng & Units 08/04/2021  Glucose 70 - 99 mg/dL 55(L)  BUN 6 - 20 mg/dL <5(L)  Creatinine 0.44 - 1.00 mg/dL 0.72  Sodium 135 - 145 mmol/L 137  Potassium 3.5 - 5.1 mmol/L 3.5  Chloride 98 - 111 mmol/L 109  CO2 22 - 32 mmol/L 22  Calcium 8.9 - 10.3 mg/dL 8.6(L)  Total Protein 6.5 - 8.1 g/dL 5.1(L)  Total Bilirubin 0.3 - 1.2 mg/dL 0.8  Alkaline Phos 38 - 126 U/L 232(H)  AST 15 - 41 U/L 22  ALT 0 - 44 U/L 11   Edinburgh Score: No flowsheet data found.   After visit meds:  Allergies as of  08/05/2021   No Known Allergies      Medication List     STOP taking these medications    calcium carbonate 500 MG chewable tablet Commonly known as: TUMS - dosed in mg elemental calcium   norethindrone 0.35 MG tablet Commonly known as: MICRONOR       TAKE these medications    Blood Pressure Kit Devi 1 kit by Does not apply route once a week.   famotidine 20 MG tablet Commonly known as: Pepcid Take 1 tablet (20 mg total) by mouth 2 (two) times daily.   ferrous sulfate 325 (65 FE) MG tablet Take 325 mg by mouth daily with breakfast.   furosemide 20 MG tablet Commonly known as: LASIX Take 1 tablet (20 mg total) by mouth 2 (two) times daily for 3 days.   ibuprofen 600 MG  tablet Commonly known as: ADVIL Take 1 tablet (600 mg total) by mouth every 6 (six) hours.   NIFEdipine 30 MG 24 hr tablet Commonly known as: ADALAT CC Take 1 tablet (30 mg total) by mouth daily.   oxyCODONE 5 MG immediate release tablet Commonly known as: Oxy IR/ROXICODONE Take 1 tablet (5 mg total) by mouth every 4 (four) hours as needed (pain scale 4-7).   Prenatal Gummies 0.18-25 MG Chew Chew 3 capsules by mouth daily before breakfast.         Discharge home in stable condition Infant Feeding: Breast Infant Disposition:home with mother Discharge instruction: per After Visit Summary and Postpartum booklet. Activity: Advance as tolerated. Pelvic rest for 6 weeks.  Diet: routine diet Future Appointments: Future Appointments  Date Time Provider [place]  08/11/2021  1:20 PM [place] None  09/03/2021 10:55 AM Nugent, Gerrie Nordmann, NP [place] None   Follow up Visit:  [place]. Schedule an appointment as soon as possible for a visit in 1 week(s).   Specialty: Obstetrics and Gynecology Why: 1 week for BP check and 4 weeks for PP visit Contact information: [address], [place] [phone]               Message sent to [facility] by Dr. Cy Blamer on 11/20  Please schedule this patient for a In person postpartum visit in 4 weeks with the following provider: MD. Additional Postpartum F/U:BP check 1 week  High risk pregnancy complicated by:  preE with severe features Delivery mode:  Vaginal, Spontaneous  Anticipated Birth Control:   Planning POPs   08/05/2021 Chancy Milroy, MD

## 2021-08-03 NOTE — Anesthesia Preprocedure Evaluation (Signed)
Anesthesia Evaluation  Patient identified by MRN, date of birth, ID band Patient awake    Reviewed: Allergy & Precautions, H&P , NPO status , Patient's Chart, lab work & pertinent test results  Airway Mallampati: II  TM Distance: >3 FB Neck ROM: Full    Dental no notable dental hx.    Pulmonary neg pulmonary ROS,    Pulmonary exam normal breath sounds clear to auscultation       Cardiovascular negative cardio ROS Normal cardiovascular exam Rhythm:Regular Rate:Normal     Neuro/Psych negative neurological ROS  negative psych ROS   GI/Hepatic negative GI ROS, Neg liver ROS,   Endo/Other  negative endocrine ROS  Renal/GU negative Renal ROS  negative genitourinary   Musculoskeletal negative musculoskeletal ROS (+)   Abdominal   Peds negative pediatric ROS (+)  Hematology negative hematology ROS (+)   Anesthesia Other Findings   Reproductive/Obstetrics (+) Pregnancy                             Anesthesia Physical Anesthesia Plan  ASA: 2  Anesthesia Plan: Epidural   Post-op Pain Management:    Induction:   PONV Risk Score and Plan: 2 and Treatment may vary due to age or medical condition  Airway Management Planned: Natural Airway  Additional Equipment:   Intra-op Plan:   Post-operative Plan:   Informed Consent: I have reviewed the patients History and Physical, chart, labs and discussed the procedure including the risks, benefits and alternatives for the proposed anesthesia with the patient or authorized representative who has indicated his/her understanding and acceptance.       Plan Discussed with: Anesthesiologist  Anesthesia Plan Comments:         Anesthesia Quick Evaluation

## 2021-08-03 NOTE — MAU Note (Signed)
..  Allison Williamson is a 20 y.o. at [redacted]w[redacted]d here in MAU reporting: Contractions since Friday that are now every 3 minutes. Reports bloody show denies leaking of fluid. +FM  Pain score: 10/10 Vitals:   08/03/21 0036 08/03/21 0037  Pulse:  90  Resp:  18  Temp:  98 F (36.7 C)  SpO2: 98% 99%     FHT:115

## 2021-08-03 NOTE — Lactation Note (Signed)
This note was copied from a baby's chart. Lactation Consultation Note  Patient Name: Allison Williamson OVANV'B Date: 08/03/2021 Reason for consult: Initial assessment;Mother's request;Term;1st time breastfeeding Age:22 hours Per mom, infant is breastfeeding well in L&D infant breastfeed for 20 minutes, OB speciality care 10 minutes. When LC entered the room, infant was cuing to breastfeed, mom latched infant on her right breast using the cradle hold position, infant latched with depth and breastfeed for 15 minutes. Afterwards mom was shown how to use hand pump and DEBP, when explaining how to use hand pump mom easily expressed 6 mls of EBM that was spoon fed to infant. Mom was shown how to use DEBP, mom prefers to start using DEBP  in the morning.  Mom shown how to use DEBP & how to disassemble, clean, & reassemble parts.  Mom's plan: 1- Mom will continue to breastfeed infant according to feeding cues, 8 to 12+ or more times within 24 hours, skin to skin. 2- Mom knows to call [facility] if she has further breastfeeding questions, concerns or need assistance with latching infant at the breast. 3- Mom will pump every 3 hours for 15 minutes on initial setting and if milk is express she will spoon fed EBM to infant.  Maternal Data Has patient been taught Hand Expression?: Yes Does the patient have breastfeeding experience prior to this delivery?: No  Feeding Mother's Current Feeding Choice: Breast Milk  LATCH Score Latch: Grasps breast easily, tongue down, lips flanged, rhythmical sucking.  Audible Swallowing: Spontaneous and intermittent  Type of Nipple: Everted at rest and after stimulation  Comfort (Breast/Nipple): Soft / non-tender  Hold (Positioning): Assistance needed to correctly position infant at breast and maintain latch.  LATCH Score: 9   Lactation Tools Discussed/Used Tools: Pump Breast pump type: Double-Electric Breast Pump Pump Education: Setup, frequency, and  cleaning;Milk Storage Reason for Pumping: Mom on Mag, LC explain how to use DEBP kit sitting on counter Pumping frequency: Mom plans to use DEBP in morning, mom understands to pump every 3 hours for 15 minutes on inital setting.  Interventions Interventions: Breast feeding basics reviewed;Assisted with latch;Skin to skin;Breast compression;Adjust position;Support pillows;Position options;Expressed milk;Hand pump;DEBP;LC Services brochure;Education  Discharge Pump: DEBP;Personal;Manual (Per mom, she has DEBP at home.) [facility] Program: Yes  Consult Status Consult Status: Follow-up Date: 08/04/21 Follow-up type: In-patient    Allison Williamson 08/03/2021, 10:25 PM

## 2021-08-03 NOTE — Progress Notes (Signed)
Labor Progress Note Allison Williamson is a 20 y.o. G2P0010 at [redacted]w[redacted]d presented for spontaneous onset of labor  S:  Patient comfortable with epidural  O:  BP 132/70   Pulse 72   Temp 98.5 F (36.9 C)   Resp 15   Ht 5\' 2"  (1.575 m)   Wt 67.9 kg   LMP 10/28/2020   SpO2 99%   BMI 27.38 kg/m   Fetal Tracing:  Baseline: 130 Variability: moderate Accels: 15x15 Decels: none  Toco: 1-2   CVE: Dilation: 4.5 Effacement (%): 70 Cervical Position: Anterior Station: -2 Presentation: Vertex Exam by:: Alexis CNM   A&P: 20 y.o. G2P0010 [redacted]w[redacted]d Early labor #Labor: Cervix unchanged. Discussed with patient risks and benefits of AROM for augmentation of labor. Patient agreeable to plan of care. AROM with moderate amount of clear fluid. Patient and FHR tolerated procedure well.   #Pain: epidural #FWB: Cat 1 #GBS negative  [redacted]w[redacted]d, CNM 10:35 AM

## 2021-08-03 NOTE — Lactation Note (Signed)
This note was copied from a baby's chart. Lactation Consultation Note  Patient Name: Allison Williamson [id number]'[id number] Date: 08/03/2021 Reason for consult: L&D Initial assessment;1st time breastfeeding;Primapara;Term Age:22 hours  LC in to assist with first feeding after birth.  Baby easily latched deeply onto breast.   Encouraged Mom to keep baby STS as much as possible and ask for help as needed.  Maternal Data Has patient been taught Hand Expression?: Yes Does the patient have breastfeeding experience prior to this delivery?: No  Feeding Mother's Current Feeding Choice: Breast Milk  LATCH Score Latch: Grasps breast easily, tongue down, lips flanged, rhythmical sucking.  Audible Swallowing: Spontaneous and intermittent  Type of Nipple: Everted at rest and after stimulation  Comfort (Breast/Nipple): Soft / non-tender  Hold (Positioning): Assistance needed to correctly position infant at breast and maintain latch.  LATCH Score: 9   Interventions Interventions: Breast feeding basics reviewed;Assisted with latch;Skin to skin;Breast massage;Support pillows;Adjust position   Consult Status Consult Status: Follow-up from L&D Date: 08/03/21 Follow-up type: In-patient    Allison Williamson 08/03/2021, 6:33 PM

## 2021-08-03 NOTE — Anesthesia Procedure Notes (Addendum)
Epidural Patient location during procedure: OB Start time: 08/03/2021 9:10 AM End time: 08/03/2021 9:25 AM  Staffing Anesthesiologist: Mellody Dance, MD Performed: anesthesiologist   Preanesthetic Checklist Completed: patient identified, IV checked, site marked, risks and benefits discussed, monitors and equipment checked, pre-op evaluation and timeout performed  Epidural Patient position: sitting Prep: DuraPrep Patient monitoring: heart rate, cardiac monitor, continuous pulse ox and blood pressure Approach: midline Location: L2-L3 Injection technique: LOR saline  Needle:  Needle type: Tuohy  Needle gauge: 17 G Needle length: 9 cm Needle insertion depth: 6 cm Catheter type: closed end flexible Catheter size: 20 Guage Catheter at skin depth: 11 cm Test dose: negative and Other  Assessment Events: blood not aspirated, injection not painful, no injection resistance and negative IV test  Additional Notes Informed consent obtained prior to proceeding including risk of failure, 1% risk of PDPH, risk of minor discomfort and bruising.  Discussed rare but serious complications including epidural abscess, permanent nerve injury, epidural hematoma.  Discussed alternatives to epidural analgesia and patient desires to proceed.  Timeout performed pre-procedure verifying patient name, procedure, and platelet count.  Patient tolerated procedure well.

## 2021-08-04 LAB — COMPREHENSIVE METABOLIC PANEL
ALT: 11 U/L (ref 0–44)
AST: 22 U/L (ref 15–41)
Albumin: 2.3 g/dL — ABNORMAL LOW (ref 3.5–5.0)
Alkaline Phosphatase: 232 U/L — ABNORMAL HIGH (ref 38–126)
Anion gap: 6 (ref 5–15)
BUN: <5 mg/dL — ABNORMAL LOW (ref 6–20)
CO2: 22 mmol/L (ref 22–32)
Calcium: 8.6 mg/dL — ABNORMAL LOW (ref 8.9–10.3)
Chloride: 109 mmol/L (ref 98–111)
Creatinine, Ser: 0.72 mg/dL (ref 0.44–1.00)
GFR, Estimated: >60 mL/min (ref >60)
Glucose, Bld: 55 mg/dL — ABNORMAL LOW (ref 70–99)
Potassium: 3.5 mmol/L (ref 3.5–5.1)
Sodium: 137 mmol/L (ref 135–145)
Total Bilirubin: 0.8 mg/dL (ref 0.3–1.2)
Total Protein: 5.1 g/dL — ABNORMAL LOW (ref 6.5–8.1)

## 2021-08-04 LAB — CBC
HCT: 33.7 % — ABNORMAL LOW (ref 36.0–46.0)
Hemoglobin: 11.6 g/dL — ABNORMAL LOW (ref 12.0–15.0)
MCH: 32.4 pg (ref 26.0–34.0)
MCHC: 34.4 g/dL (ref 30.0–36.0)
MCV: 94.1 fL (ref 80.0–100.0)
Platelets: 194 10*3/uL (ref 150–400)
RBC: 3.58 MIL/uL — ABNORMAL LOW (ref 3.87–5.11)
RDW: 13.2 % (ref 11.5–15.5)
WBC: 13.3 10*3/uL — ABNORMAL HIGH (ref 4.0–10.5)
nRBC: 0 % (ref 0.0–0.2)

## 2021-08-04 MED ORDER — POTASSIUM CHLORIDE CRYS ER 20 MEQ PO TBCR
40.0000 meq | EXTENDED_RELEASE_TABLET | Freq: Every day | ORAL | Status: DC
  Administered 2021-08-04 – 2021-08-05 (×2): 40 meq via ORAL
  Filled 2021-08-04 (×2): qty 2

## 2021-08-04 MED ORDER — FUROSEMIDE 40 MG PO TABS
20.0000 mg | ORAL_TABLET | Freq: Two times a day (BID) | ORAL | Status: DC
  Administered 2021-08-04 – 2021-08-05 (×2): 20 mg via ORAL
  Filled 2021-08-04 (×2): qty 1

## 2021-08-04 NOTE — Anesthesia Postprocedure Evaluation (Signed)
Anesthesia Post Note  Patient: Allison Williamson  Procedure(s) Performed: AN AD HOC LABOR EPIDURAL     Patient location during evaluation: Mother Baby Anesthesia Type: Epidural Level of consciousness: awake and alert Pain management: pain level controlled Vital Signs Assessment: post-procedure vital signs reviewed and stable Respiratory status: spontaneous breathing, nonlabored ventilation and respiratory function stable Cardiovascular status: stable Postop Assessment: no headache, no backache and epidural receding Anesthetic complications: no   No notable events documented.  Last Vitals:  Vitals:   08/04/21 0600 08/04/21 0713  BP:  121/75  Pulse:  87  Resp: 18 18  Temp:  36.5 C  SpO2:  99%    Last Pain:  Vitals:   08/04/21 0713  TempSrc: Oral  PainSc:    Pain Goal: Patients Stated Pain Goal: 0 (08/03/21 0036)                 Fanny Dance

## 2021-08-04 NOTE — Lactation Note (Signed)
This note was copied from a baby's chart. Lactation Consultation Note  Patient Name: Allison Williamson [id number] Date: 08/04/2021 Reason for consult: Follow-up assessment;Primapara;1st time breastfeeding;Term  MgSO4 for GHTN Age:22 hours  LC in to visit with P1 Mom of term baby.  Baby's birth weight was 3572 and when reweighed at morning weight at 12 hrs old, baby was 4. This is an increase of 7%.  Will monitor infants feedings and output.   Mom has been instructed on use of hand pump and DEBP every 3 hrs.  Mom had just pumped 15 ml EBM.  Placed this in refrigerator for use later.  Mom aware that milk is good for 4 days in frig.  Baby has had 3 bottles of formula and EBM.    Asked Mom what her goal was and she shared she wants to exclusively breastfeed.  Encouraged focus on latching baby to the breast, rather than a lot of pumping.  If Mom chooses to pump and expresses EBM, she will spoon feed baby.    Baby cueing and LC offered assistance with latching baby.   Baby supported on pillows and assisted Mom in using cross cradle hold.  Baby latched easily and deeply.  Showed Mom how to flange lower lip and Mom stated the latch was comfortable.  Swallowing identified.  Basics reviewed.  If Mom choosing to supplement baby, LC recommended double pumping and using her colostrum as first choice.  Mom very appreciative of assistance.   Encouraged STS as much as possible to encouraged frequent feedings.  Mom to ask for help prn.  Feeding Mother's Current Feeding Choice: Breast Milk Nipple Type: Slow - flow  LATCH Score Latch: Grasps breast easily, tongue down, lips flanged, rhythmical sucking.  Audible Swallowing: Spontaneous and intermittent  Type of Nipple: Everted at rest and after stimulation  Comfort (Breast/Nipple): Soft / non-tender  Hold (Positioning): Assistance needed to correctly position infant at breast and maintain latch.  LATCH Score: 9   Lactation Tools  Discussed/Used Tools: Pump;Flanges;Bottle Flange Size: 24 Breast pump type: Double-Electric Breast Pump Pump Education: Setup, frequency, and cleaning Pumping frequency: Mom encouraged to focus on latching baby to the breast Pumped volume: 15 mL  Interventions Interventions: Breast feeding basics reviewed;Assisted with latch;Skin to skin;Breast massage;Hand express;Adjust position;Support pillows;Position options;Breast compression;Education;DEBP  Discharge Pump: Personal (Owns an [employer] DEBP at home)  Consult Status Consult Status: Follow-up Date: 08/05/21 Follow-up type: In-patient    [name] 08/04/2021, 12:58 PM

## 2021-08-04 NOTE — Progress Notes (Signed)
POSTPARTUM PROGRESS NOTE  PPD #1  Subjective:  Lovene Maret is a 20 y.o. G2P1011 s/p NSVD at [redacted]w[redacted]d. Today she notes that she is feeling much better. She denies any problems with ambulating, voiding or po intake. Denies nausea or vomiting. She has passed a little flatus, noBM.  Pain is well controlled.  Lochia minimal Denies fever/chills/chest pain/SOB.  no HA, no blurry vision, noRUQ pain  Objective: Blood pressure 121/75, pulse 87, temperature 97.7 F (36.5 C), temperature source Oral, resp. rate 18, height 5\' 2"  (1.575 m), weight 67.9 kg, last menstrual period 10/28/2020, SpO2 99 %, unknown if currently breastfeeding.  Physical Exam:  General: alert, cooperative and no distress Chest: no respiratory distress Heart: regular rate and rhythm Abdomen: soft, nontender, Uterine Fundus: firm, appropriately tender DVT Evaluation: No calf swelling or tenderness Extremities: no edema Skin: warm, dry  Results for orders placed or performed during the hospital encounter of 08/03/21 (from the past 24 hour(s))  CBC     Status: Abnormal   Collection Time: 08/03/21  7:10 PM  Result Value Ref Range   WBC 11.1 (H) 4.0 - 10.5 K/uL   RBC 4.22 3.87 - 5.11 MIL/uL   Hemoglobin 13.4 12.0 - 15.0 g/dL   HCT 08/05/21 42.7 - 06.2 %   MCV 93.4 80.0 - 100.0 fL   MCH 31.8 26.0 - 34.0 pg   MCHC 34.0 30.0 - 36.0 g/dL   RDW 37.6 28.3 - 15.1 %   Platelets 207 150 - 400 K/uL   nRBC 0.0 0.0 - 0.2 %  Comprehensive metabolic panel     Status: Abnormal   Collection Time: 08/03/21  7:10 PM  Result Value Ref Range   Sodium 138 135 - 145 mmol/L   Potassium 3.5 3.5 - 5.1 mmol/L   Chloride 108 98 - 111 mmol/L   CO2 21 (L) 22 - 32 mmol/L   Glucose, Bld 78 70 - 99 mg/dL   BUN 7 6 - 20 mg/dL   Creatinine, Ser 08/05/21 0.44 - 1.00 mg/dL   Calcium 9.7 8.9 - 6.07 mg/dL   Total Protein 6.3 (L) 6.5 - 8.1 g/dL   Albumin 2.7 (L) 3.5 - 5.0 g/dL   AST 22 15 - 41 U/L   ALT 13 0 - 44 U/L   Alkaline Phosphatase 295 (H)  38 - 126 U/L   Total Bilirubin 1.0 0.3 - 1.2 mg/dL   GFR, Estimated 37.1 >06 mL/min   Anion gap 9 5 - 15  CBC     Status: Abnormal   Collection Time: 08/04/21  4:28 AM  Result Value Ref Range   WBC 13.3 (H) 4.0 - 10.5 K/uL   RBC 3.58 (L) 3.87 - 5.11 MIL/uL   Hemoglobin 11.6 (L) 12.0 - 15.0 g/dL   HCT 08/06/21 (L) 94.8 - 54.6 %   MCV 94.1 80.0 - 100.0 fL   MCH 32.4 26.0 - 34.0 pg   MCHC 34.4 30.0 - 36.0 g/dL   RDW 27.0 35.0 - 09.3 %   Platelets 194 150 - 400 K/uL   nRBC 0.0 0.0 - 0.2 %  Comprehensive metabolic panel     Status: Abnormal   Collection Time: 08/04/21  4:28 AM  Result Value Ref Range   Sodium 137 135 - 145 mmol/L   Potassium 3.5 3.5 - 5.1 mmol/L   Chloride 109 98 - 111 mmol/L   CO2 22 22 - 32 mmol/L   Glucose, Bld 55 (L) 70 - 99 mg/dL   BUN <  5 (L) 6 - 20 mg/dL   Creatinine, Ser 1.82 0.44 - 1.00 mg/dL   Calcium 8.6 (L) 8.9 - 10.3 mg/dL   Total Protein 5.1 (L) 6.5 - 8.1 g/dL   Albumin 2.3 (L) 3.5 - 5.0 g/dL   AST 22 15 - 41 U/L   ALT 11 0 - 44 U/L   Alkaline Phosphatase 232 (H) 38 - 126 U/L   Total Bilirubin 0.8 0.3 - 1.2 mg/dL   GFR, Estimated >99 >37 mL/min   Anion gap 6 5 - 15    Assessment/Plan: Terrance Lanahan is a 20 y.o. G2P1011 s/p NSVD at [redacted]w[redacted]d PPD#1 complicated by: 1) Preeclampsia with severe features -currently on Magnesium x 24hrs, to stop this evening -on Procardia Xl 30mg  daily- will hold if BP <100/60 -currently asymptomatic, adequate diuresis  2) Postpartum care -meeting milestones appropriately  Contraception: POPs Feeding: breast  Dispo: Continue with postpartum care as outlined above, plan to discontinue Mag later today and watch BP trends.   LOS: 1 day   , DO Faculty Attending, Center for [facility] 08/04/2021, 7:20 AM

## 2021-08-05 ENCOUNTER — Inpatient Hospital Stay (HOSPITAL_COMMUNITY): Payer: Federal, State, Local not specified - PPO

## 2021-08-05 ENCOUNTER — Other Ambulatory Visit (HOSPITAL_COMMUNITY): Payer: Self-pay

## 2021-08-05 ENCOUNTER — Inpatient Hospital Stay (HOSPITAL_COMMUNITY)
Admission: AD | Admit: 2021-08-05 | Payer: Federal, State, Local not specified - PPO | Source: Home / Self Care | Admitting: Obstetrics & Gynecology

## 2021-08-05 MED ORDER — FUROSEMIDE 20 MG PO TABS
20.0000 mg | ORAL_TABLET | Freq: Two times a day (BID) | ORAL | 0 refills | Status: AC
  Filled 2021-08-05: qty 6, 3d supply, fill #0

## 2021-08-05 MED ORDER — NIFEDIPINE ER 30 MG PO TB24
30.0000 mg | ORAL_TABLET | Freq: Every day | ORAL | 1 refills | Status: AC
  Filled 2021-08-05: qty 30, 30d supply, fill #0

## 2021-08-05 MED ORDER — OXYCODONE HCL 5 MG PO TABS
5.0000 mg | ORAL_TABLET | ORAL | 0 refills | Status: AC | PRN
  Filled 2021-08-05: qty 28, 7d supply, fill #0

## 2021-08-05 MED ORDER — IBUPROFEN 600 MG PO TABS
600.0000 mg | ORAL_TABLET | Freq: Four times a day (QID) | ORAL | 0 refills | Status: AC
  Filled 2021-08-05: qty 30, 8d supply, fill #0

## 2021-08-05 NOTE — Lactation Note (Signed)
This note was copied from a baby's chart. Lactation Consultation Note  Patient Name: Allison Williamson [id number] Date: 08/05/2021 Reason for consult: Follow-up assessment Age:22 hours  Mother is primarily breastfeeding.  Occasionally gives baby a bottle of formula.  Recommend pumping when formula is given to establish milk supply.  Feed on demand with cues.  Goal 8-12+ times per day after first 24 hrs.  Place baby STS if not cueing.  Reviewed engorgement care and monitoring voids/stools. Reminded mother to take pump parts home.  Call Lactation if assistance is needed.   Feeding Mother's Current Feeding Choice: Breast Milk and Formula Nipple Type: Nfant Slow Flow (purple) Lactation Tools Discussed/Used Tools: Pump  Interventions Interventions: Education  Discharge Discharge Education: Engorgement and breast care;Warning signs for feeding baby Pump: Personal;DEBP  Consult Status Consult Status: Complete Date: 08/05/21    Dahlia Byes [facility] 08/05/2021, 10:47 AM

## 2021-08-05 NOTE — Progress Notes (Signed)
Discharge instructions and prescriptions given to pt. Discussed post vaginal delivery care, signs and symptoms to report to the MD, upcoming appointments, and meds. Pt verbalizes understanding and has no questions or concerns at this time. Pt discharged home from hospital with infant in stable condition.

## 2021-08-08 ENCOUNTER — Other Ambulatory Visit: Payer: Self-pay | Admitting: Obstetrics

## 2021-08-08 DIAGNOSIS — O99013 Anemia complicating pregnancy, third trimester: Secondary | ICD-10-CM

## 2021-08-11 ENCOUNTER — Ambulatory Visit (INDEPENDENT_AMBULATORY_CARE_PROVIDER_SITE_OTHER): Payer: Federal, State, Local not specified - PPO

## 2021-08-11 ENCOUNTER — Other Ambulatory Visit: Payer: Self-pay

## 2021-08-11 DIAGNOSIS — Z0130 Encounter for examination of blood pressure without abnormal findings: Secondary | ICD-10-CM

## 2021-08-11 NOTE — Progress Notes (Signed)
Subjective:  Allison Williamson is a 20 y.o. female here for BP check.   Hypertension ROS: taking medications as instructed, no medication side effects noted, no TIA's, no chest pain on exertion, no dyspnea on exertion, and no swelling of ankles.    Objective:  LMP 10/28/2020   Appearance alert, well appearing, and in no distress. General exam BP noted to be well controlled today in office.    Assessment:   Blood Pressure well controlled.   Plan:  Current treatment plan is effective, no change in therapy.Marland Kitchen

## 2021-08-11 NOTE — Progress Notes (Signed)
Agree with plan as noted by Nurse, Ariel.

## 2021-08-13 DIAGNOSIS — F4323 Adjustment disorder with mixed anxiety and depressed mood: Secondary | ICD-10-CM | POA: Diagnosis not present

## 2021-08-16 ENCOUNTER — Telehealth (HOSPITAL_COMMUNITY): Payer: Self-pay | Admitting: *Deleted

## 2021-08-16 NOTE — Telephone Encounter (Signed)
Attempted hospital discharge follow-up call. Left message for patient to return RN call. Deforest Hoyles, RN, 08/16/21, 1020

## 2021-09-03 ENCOUNTER — Ambulatory Visit (INDEPENDENT_AMBULATORY_CARE_PROVIDER_SITE_OTHER): Payer: Federal, State, Local not specified - PPO | Admitting: Women's Health

## 2021-09-03 ENCOUNTER — Encounter: Payer: Self-pay | Admitting: Women's Health

## 2021-09-03 ENCOUNTER — Other Ambulatory Visit: Payer: Self-pay

## 2021-09-03 NOTE — Patient Instructions (Signed)
AREA FAMILY PRACTICE PHYSICIANS  [place] ([id number]) [facility] [address] [place], [place] [address] [phone] Mon-Fri 8:30-12:30, 1:30-5:00 Accepting Medicaid [facility] [address], [place], [place] [address] [phone] Mon-Fri 8:00-5:30 [facility] [address] [place], [place] [address] [phone] Mon, Tue, Thur, Fri 8:30-5:00, Wed 10:00-7:00 (closed 1-2pm) Accepting Medicaid [facility] [address], [place], [place]  [address] Phone - [phone]   Fax - [phone]  [place] ([address]) [facility] [address] [place], [place] [address] [phone] Mon-Fri 8:00-5:00 [facility] ([facility])  [address] [place], [place] [address] [phone] Mon-Fri 8:30-5:30, Sat (Oct.-Mar.) 9:00-1:00 Accepting Medicaid  [place] ([address]) [facility] [address], [place], [place] [address] [phone] Mon-Fri 8:00-5:00  [place] ([address]) [facility] [address], [place], [place] [address] [phone] Mon-Fri 8:00-5:00 [facility] [address], [place], [place] [address] [phone] Mon-Fri 8:00-5:00 [facility] [address] [place], [place] [address] [phone] Mon-Fri 8:00-5:00 [facility] [address] [place] [address] [phone] Mon-Fri 7:30-5:30  [place] ([address] & [address]) [facility] [address] [place], [place] [address] [phone] Mon-Thur 8:00-6:00 Accepting Medicaid [facility] [address] [place], [place] [address] [phone] Mon-Thur 7:30-7:30, Fri 7:30-4:30 Accepting  Medicaid [facility] [address], [place], [place]  [address] [phone]   Fax - [phone]  [place] ([address]) [facility] [address] [place], [place] [address] [phone] Mon-Fri 7:00-5:00 [facility] [address], [place], [place] [address] [phone] Mon-Fri 8:00-5:00 Accepting Medicaid [facility] [address], , [place] [address] [phone] Mon-Fri 8:00-5:00 Accepting Medicaid  [place] ([address]) [facility] [place] [address] [place], [place] [address] [phone] Mon-Fri 8:00-5:00 [facility] [address], [place], [place] [address] [phone] Mon-Fri 8:00-5:00 Accepting Medicaid [facility] ([facility]) [address], [place], [place] [address] [phone] Mon-Fri 8:00-5:30, Sat&Sun by appointment (phones open at 8:30) Accepting Medicaid  [place] ([address]) [facility] [address] [place], [place] [address] [phone] Mon-Thur 8:00-7:00, Fri 8:00-5:00, Sat 8:00-12:00, Sun 9:00-12:00 Accepting Medicaid [facility] [place] [address], [place], [place] [address] [phone] Mon-Thur 8:00-5:00 Accepting Medicaid [facility] [address] [place], [place] [address] [phone] Mon-Fri 8:00-5:30, Sat (Oct.-Mar.) 9:00-1:00 Accepting Medicaid  [place] ([address]) [facility] [address], [place], [place] [address] [phone] Mon-Fri 8:00-5:00 Accepting Medicaid   [place] ([address]) [facility] [place] [address], [place], [place] [address] [phone] Mon-Fri 8:00-5:00 [facility] [place] [address], [place], [place] [address] [phone] Mon-Fri 8:00-5:00 [facility] [place] [address] [place] [address], [place], [place] [address] [phone] Mon-Fri 8:00-5:00 After hours clinic ([address] [place],  [address]) [phone] Mon-Fri 5:00-8:00, Sat 12:00-6:00, Sun 10:00-4:00 Accepting Medicaid [facility] [place]   [address], [place], [place]  [address] [phone]   Fax - [phone]  [place] ([address])  [facility] [place] [address], [place], [place] [address] [phone] Mon-Fri 8:00-5:00 [facility] [place] ([facility] [place]) [address], [place], [place] [address] [phone] Mon-Thur 8:00-7:00, Fri 8:00-5:00, Sat 8:00-12:00    

## 2021-09-03 NOTE — Progress Notes (Signed)
Post Partum Visit Note  Allison Williamson is a 20 y.o. G75P1011 female who presents for a postpartum visit. She is 4 weeks postpartum following a normal spontaneous vaginal delivery.  I have fully reviewed the prenatal and intrapartum course. The delivery was at 39.6 gestational weeks.  Anesthesia: epidural. Postpartum course has been unremarkable. Baby is doing well. Baby is feeding by breast. Bleeding no bleeding. Bowel function is normal. Bladder function is normal. Patient is not sexually active. Contraception method is OCP (estrogen/progesterone). Postpartum depression screening: negative.   Upstream - 09/03/21 1107       Pregnancy Intention Screening   Does the patient want to become pregnant in the next year? No    Does the patient's partner want to become pregnant in the next year? No    Would the patient like to discuss contraceptive options today? No      Contraception Wrap Up   Current Method Oral Contraceptive    End Method Oral Contraceptive            The pregnancy intention screening data noted above was reviewed. Potential methods of contraception were discussed. The patient elected to proceed with Oral Contraceptive.   Edinburgh Postnatal Depression Scale - 09/03/21 1059       Edinburgh Postnatal Depression Scale:  In the Past 7 Days   I have been able to laugh and see the funny side of things. 0    I have looked forward with enjoyment to things. 0    I have blamed myself unnecessarily when things went wrong. 0    I have been anxious or worried for no good reason. 0    I have felt scared or panicky for no good reason. 0    Things have been getting on top of me. 0    I have been so unhappy that I have had difficulty sleeping. 0    I have felt sad or miserable. 0    I have been so unhappy that I have been crying. 0    The thought of harming myself has occurred to me. 0    Edinburgh Postnatal Depression Scale Total 0             Health Maintenance  Due  Topic Date Due   HPV VACCINES (1 - 2-dose series) Never done    The following portions of the patient's history were reviewed and updated as appropriate: allergies, current medications, past family history, past medical history, past social history, past surgical history, and problem list.  Review of Systems Pertinent items noted in HPI and remainder of comprehensive ROS otherwise negative.  Objective:  BP 107/67    Pulse 83    Wt 127 lb (57.6 kg)    LMP 10/28/2020    Breastfeeding Yes    BMI 23.23 kg/m    General:  alert, cooperative, and no distress   Breasts:  declined  Lungs: clear to auscultation bilaterally  Heart:  regular rate and rhythm, S1, S2 normal, no murmur, click, rub or gallop  Abdomen: soft, non-tender; bowel sounds normal; no masses,  no organomegaly   Wound N/a  GU exam:  not indicated       Assessment:   1. Postpartum exam   Normal postpartum exam.   Plan:   Essential components of care per ACOG recommendations:  1.  Mood and well being: Patient with negative depression screening today. - Patient tobacco use? No.   - hx of drug use? No.  2. Infant care and feeding:  -Patient currently breastmilk feeding? Yes. Discussed returning to work and pumping. Reviewed importance of draining breast regularly to support lactation.   3. Sexuality, contraception and birth spacing - Patient does not want a pregnancy in the next year.  Desired family size is 2 children.  - Reviewed forms of contraception in tiered fashion. Patient desired oral progesterone-only contraceptive today.  Patient satisfied with method. - Discussed birth spacing of 18 months  4. Sleep and fatigue -Encouraged family/partner/community support of 4 hrs of uninterrupted sleep to help with mood and fatigue  5. Physical Recovery  - Discussed patients delivery and complications. She describes her labor as good. - Patient had a Vaginal, no problems at delivery. Patient had a 1st degree  laceration. Perineal healing reviewed. Patient expressed understanding - Patient has urinary incontinence? No. - Patient is safe to resume physical and sexual activity  6.  Health Maintenance - HM due items addressed Yes - Last pap smear  Diagnosis  Date Value Ref Range Status  01/16/2021   Final   - Negative for intraepithelial lesion or malignancy (NILM)   Pap smear not done at today's visit.  -Breast Cancer screening indicated? No.   7. Chronic Disease/Pregnancy Condition follow up: None  - PCP follow up, list given   Center for [employer], [facility]

## 2021-09-18 ENCOUNTER — Telehealth: Payer: Self-pay

## 2021-09-18 NOTE — Telephone Encounter (Signed)
Written oder for Breast Pump was signed and faxed 09/16/2021

## 2022-04-16 ENCOUNTER — Ambulatory Visit (HOSPITAL_COMMUNITY): Payer: BC Managed Care – PPO

## 2022-04-16 IMAGING — US US MFM OB DETAIL+14 WK
1 series · 13 of 28 positions shown · non-contrast
Comparison: none

[Series 1: us mfm ob detail+14 wk · 13 of 151 slices shown]
[im 6/151]
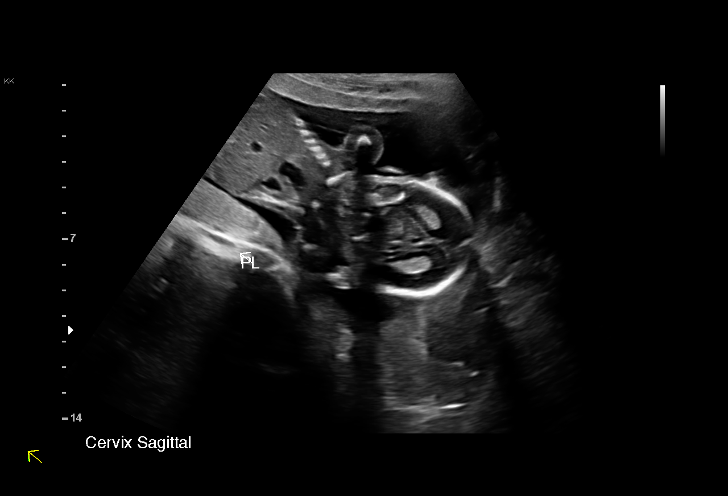
[im 17/151]
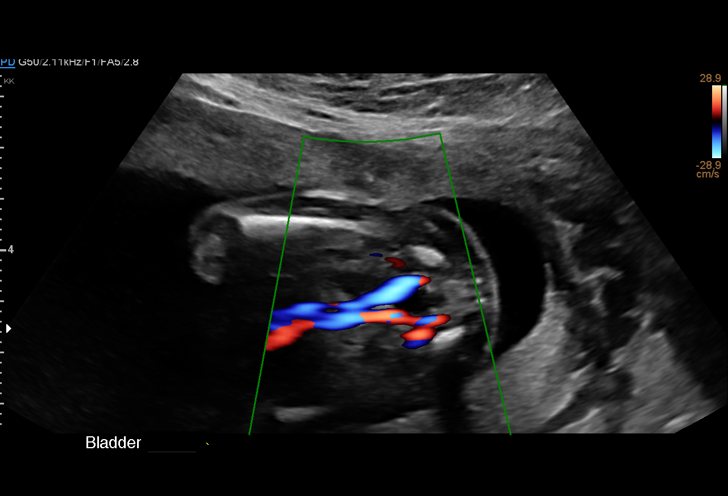
[im 28/151]
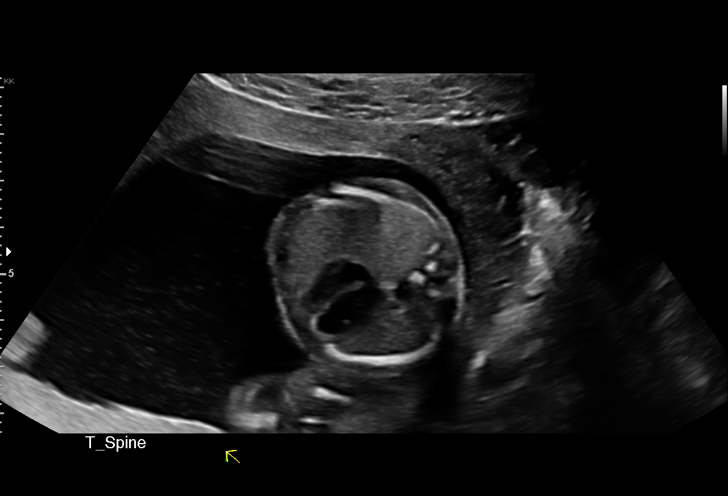
[im 39/151]
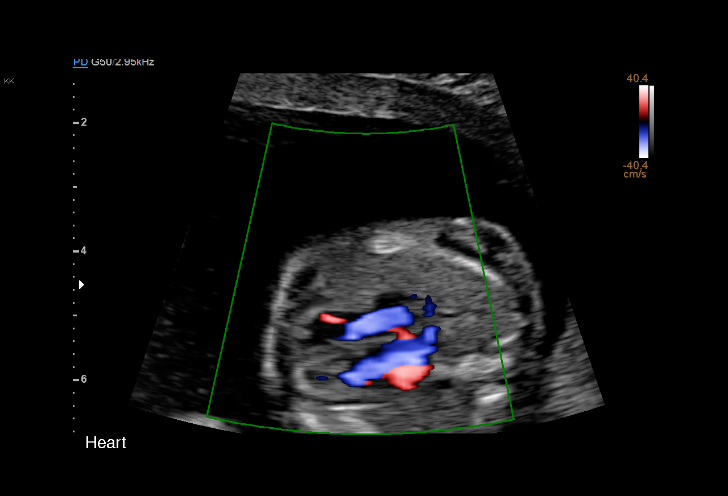
[im 51/151]
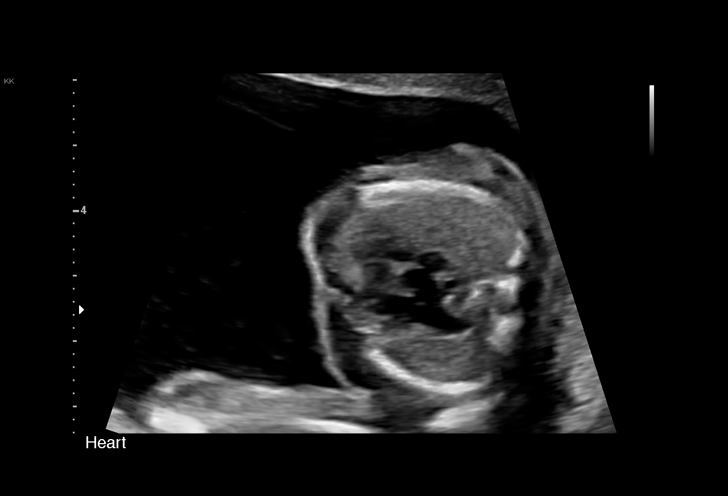
[im 62/151]
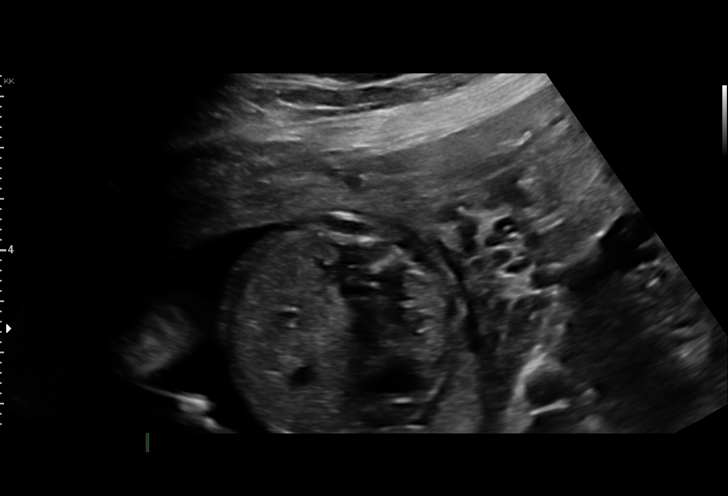
[im 78/151]
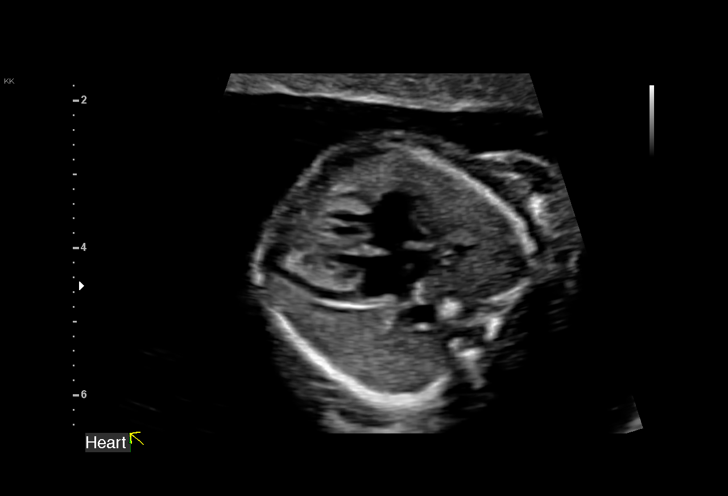
[im 89/151]
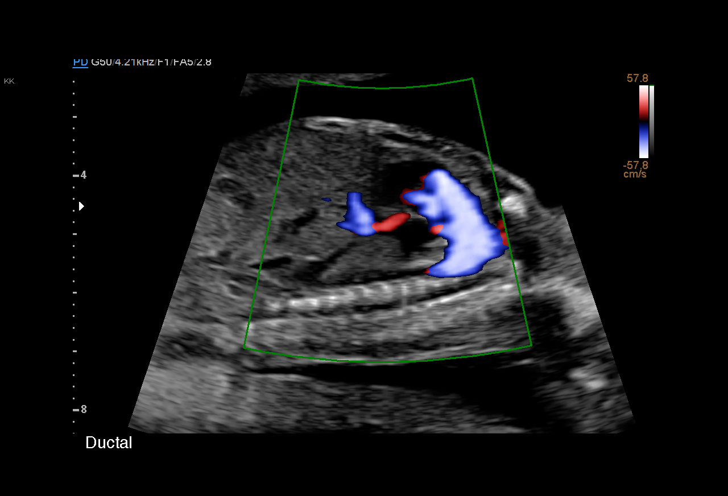
[im 101/151]
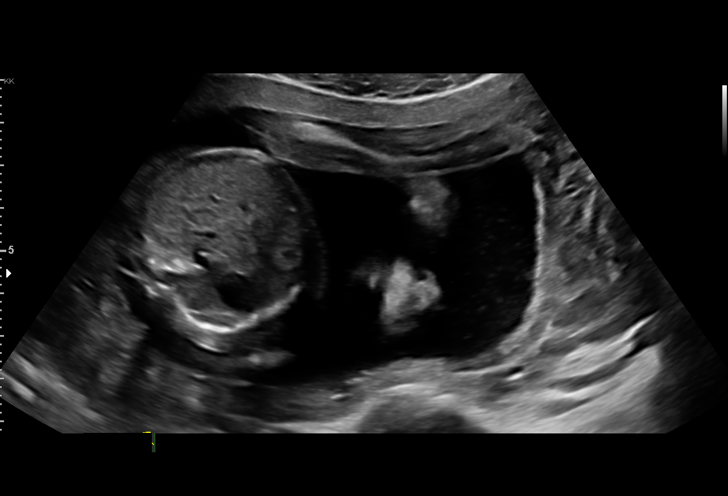
[im 112/151]
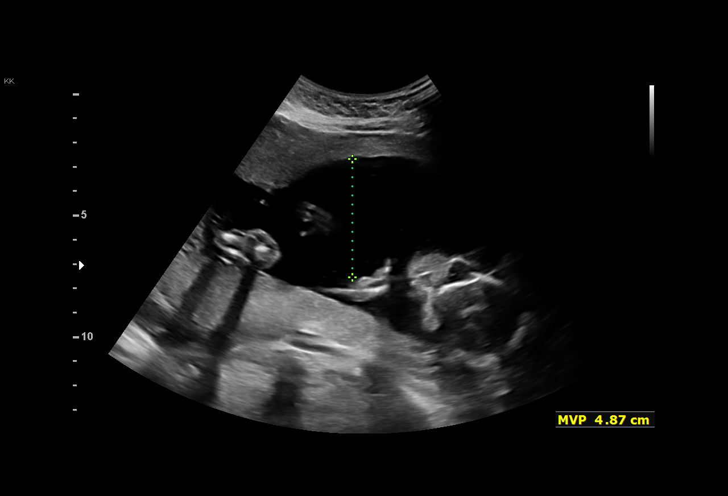
[im 123/151]
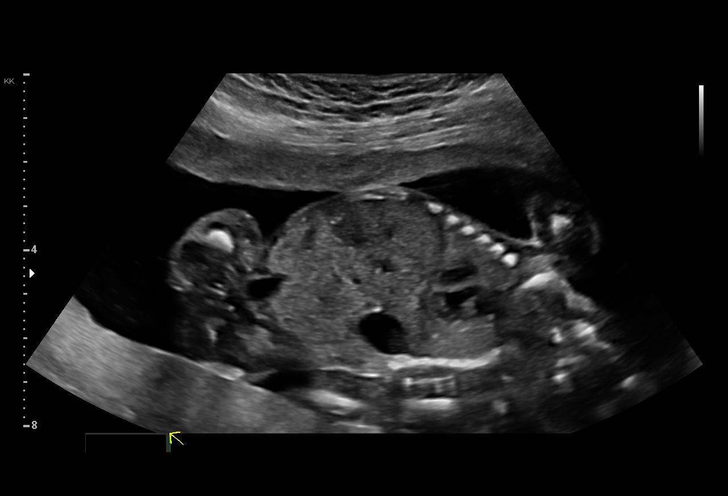
[im 134/151]
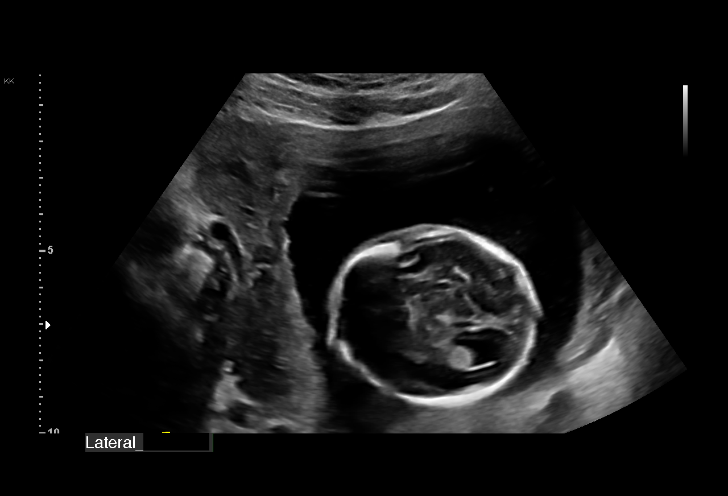
[im 145/151]
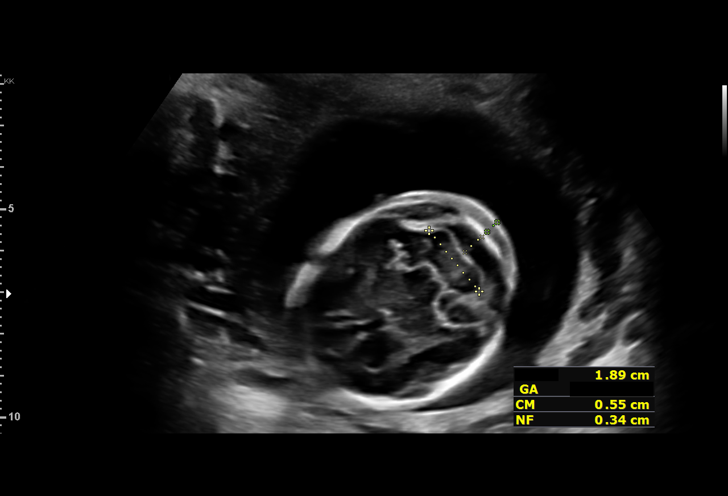

[13 of 28 positions shown; findings below may reference images not displayed]

VELTRAN

                   HENDRATNO NP

Indications

 19 weeks gestation of pregnancy
 Encounter for antenatal screening for
 malformations
 Low Risk NIPS(Negative AFP)
 Genetic carrier (Carrier risk for SMA)
 Echogenic intracardiac focus of the heart
 (EIF)
Vital Signs

 BMI:
Fetal Evaluation

 Num Of Fetuses:         1
 Fetal Heart Rate(bpm):  147
 Cardiac Activity:       Observed
 Presentation:           Cephalic
 Placenta:               Posterior Fundal
 P. Cord Insertion:      Visualized

 Amniotic Fluid
 AFI FV:      Within normal limits

                             Largest Pocket(cm)

Biometry
 BPD:      45.6  mm     G. Age:  19w 5d         66  %    CI:        77.31   %    70 - 86
                                                         FL/HC:      19.1   %    16.1 -
 HC:      164.2  mm     G. Age:  19w 1d         29  %    HC/AC:      1.13        1.09 -
 AC:      145.6  mm     G. Age:  19w 6d         60  %    FL/BPD:     68.9   %
 FL:       31.4  mm     G. Age:  19w 5d         55  %    FL/AC:      21.6   %    20 - 24
 HUM:      29.5  mm     G. Age:  19w 5d         57  %
 CER:      18.9  mm     G. Age:  18w 4d         19  %
 NFT:       3.4  mm

 LV:          7  mm
 CM:        5.5  mm

 Est. FW:     311  gm    0 lb 11 oz      65  %
OB History

 Gravidity:    2         Term:   0        Prem:   0        SAB:   1
 TOP:          0       Ectopic:  0        Living: 0
Gestational Age

 LMP:           19w 3d        Date:  10/28/20                 EDD:   08/04/21
 U/S Today:     19w 4d                                        EDD:   08/03/21
 Best:          19w 3d     Det. By:  LMP  (10/28/20)          EDD:   08/04/21
Anatomy

 Cranium:               Appears normal         Aortic Arch:            Not well visualized
 Cavum:                 Appears normal         Ductal Arch:            Appears normal
 Ventricles:            Appears normal         Diaphragm:              Appears normal
 Choroid Plexus:        Appears normal         Stomach:                Appears normal, left
                                                                       sided
 Cerebellum:            Appears normal         Abdomen:                Appears normal
 Posterior Fossa:       Appears normal         Abdominal Wall:         Appears nml (cord
                                                                       insert, abd wall)
 Nuchal Fold:           Appears normal         Cord Vessels:           Appears normal (3
                                                                       vessel cord)
 Face:                  Appears normal         Kidneys:                Appear normal
                        (orbits and profile)
 Lips:                  Appears normal         Bladder:                Appears normal
 Thoracic:              Appears normal         Spine:                  Appears normal
 Heart:                 Appears normal; EIF    Upper Extremities:      Appears normal
 RVOT:                  Appears normal         Lower Extremities:      Appears normal
 LVOT:                  Appears normal

 Other:  Heels visualized. Hands visualized. Technically difficult due to fetal
         position.
Targeted Anatomy

 Thorax
 3 V Trachea View:      Appears normal         IVC:                    Appears normal
Cervix Uterus Adnexa

 Cervix
 Length:            3.5  cm.
 Normal appearance by transabdominal scan.
 Adnexa
 No abnormality visualized.
Impression

 G2 P0. Patient is here for fetal anatomy scan. On cell-free
 fetal DNA screening, the risks of aneuploidies are not
 increased.
 We performed fetal anatomy scan. An echogenic intracardiac
 focus is seen. No other makers of aneuploidies or fetal
 structural defects are seen. Fetal biometry is consistent with
 her previously-established dates. Amniotic fluid is normal and
 good fetal activity is seen.
 I informed the patient that given that she had Rahardjo Jamnasi for fetal
 aneuploidies on cell-free fetal DNA screening, finding of
 echogenic intracardiac focus should be considered a normal
 variant and that the risk of trisomy 21 is not increased. I also
 reassured that echogenic focus does not increase the risk of
 cardiac defects. I also informed her that only amniocentesis
 will give a defintive result on the fetal karyotype.

 Patient has an increased carrier risk for Spinal Muscular
 Atrophy .I discussed the significance of this carrier status and
 recommended partner screening.  Patient informed that her
 partner is not involved in pregnancy.  Alternatively, she can
 opt for amniocentesis to determine if the fetus is affected with
 spinal muscular atrophy.

 Patient opted not to have amniocentesis.
Recommendations

 -An appointment was made for her to return in 4 weeks for
 completion of fetal anatomy (aortic arch).
                 Firooz, Dongmyung

## 2022-07-15 IMAGING — CT CT ANGIO CHEST
2 of 6 series · 18 of 46 positions shown · IV contrast (omnipaque)
Comparison: No priors.

CLINICAL DATA: 19-year-old female with history of syncopal episode
and shortness of breath. Evaluate for pulmonary embolism.

EXAM:
CT ANGIOGRAPHY CHEST WITH CONTRAST
TECHNIQUE: Multidetector CT imaging of the chest was performed using the
standard protocol during bolus administration of intravenous
contrast. Multiplanar CT image reconstructions and MIPs were
obtained to evaluate the vascular anatomy.
CONTRAST:  60mL OMNIPAQUE IOHEXOL 350 MG/ML SOLN

[Series 6: thins · axial · 0.63mm/px · z∈[+1268,+1465]mm · 15 of 217 slices shown]
[im 10/217  lung]
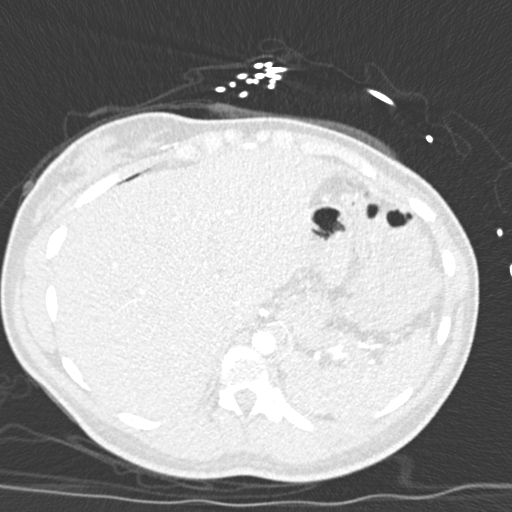
[im 29/217  soft-tissue]
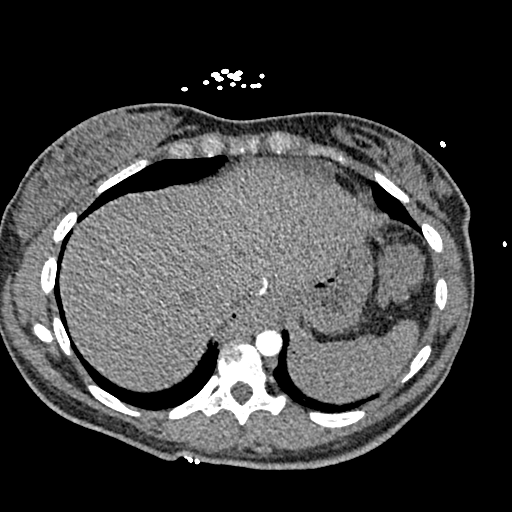
[im 38/217  lung]
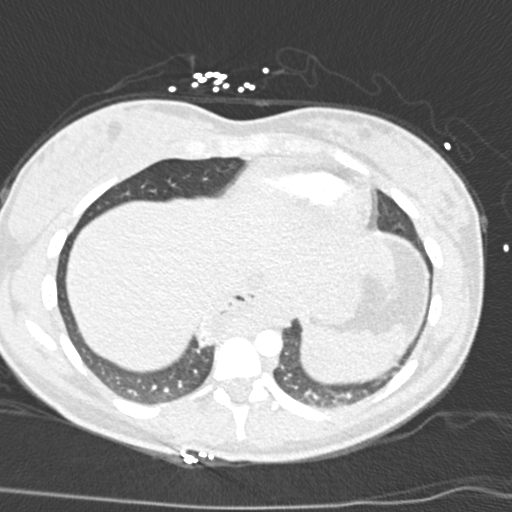
[im 57/217  soft-tissue]
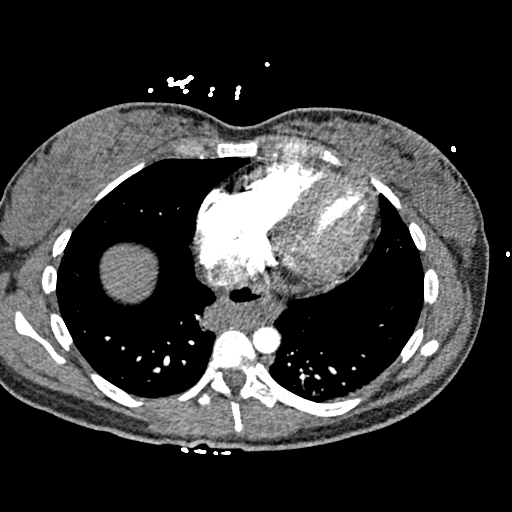
[im 66/217  lung]
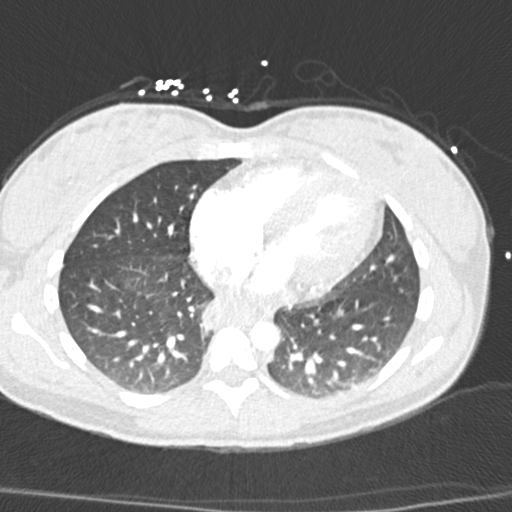
[im 85/217  soft-tissue]
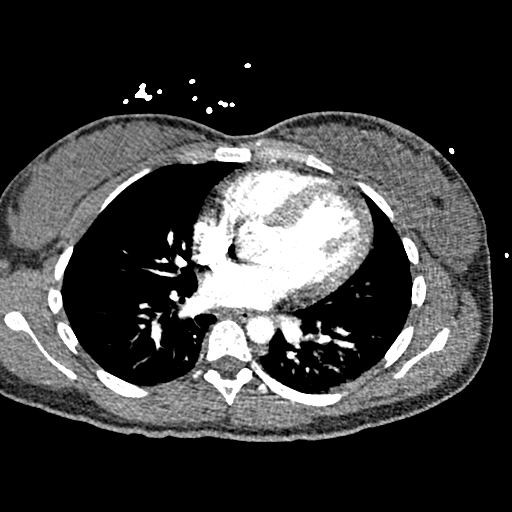
[im 94/217  lung]
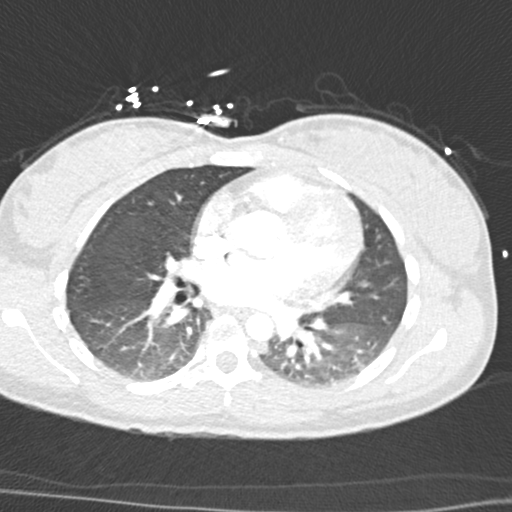
[im 113/217  soft-tissue]
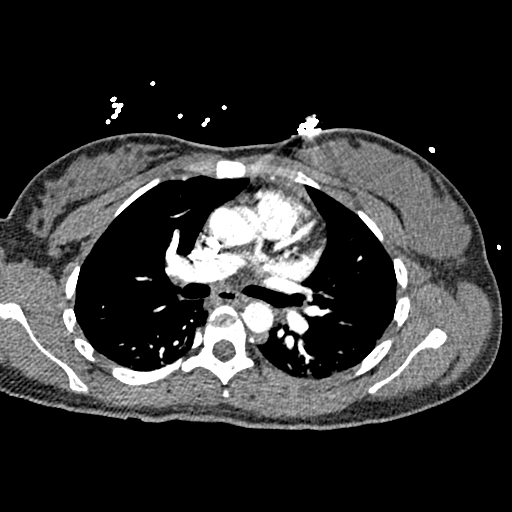
[im 123/217  lung]
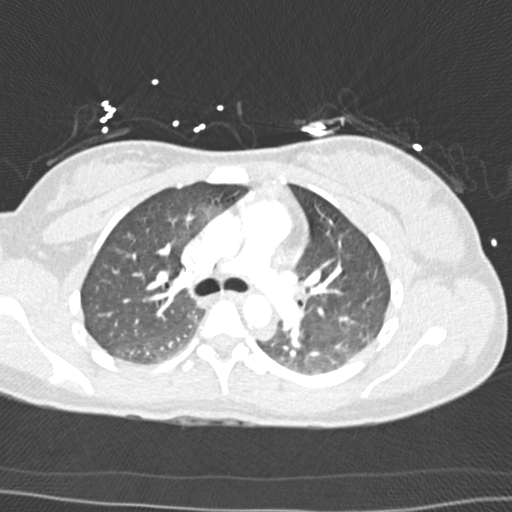
[im 132/217  soft-tissue]
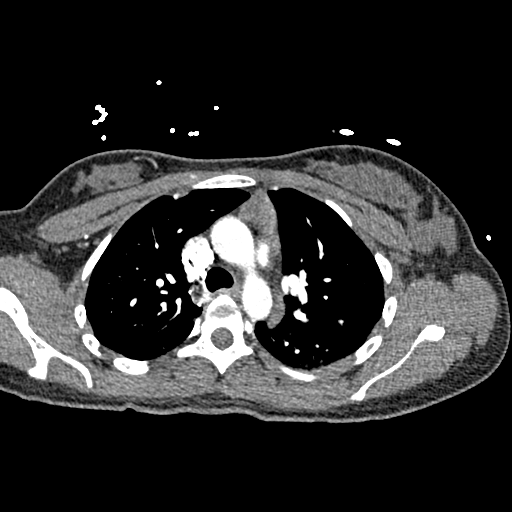
[im 151/217  lung]
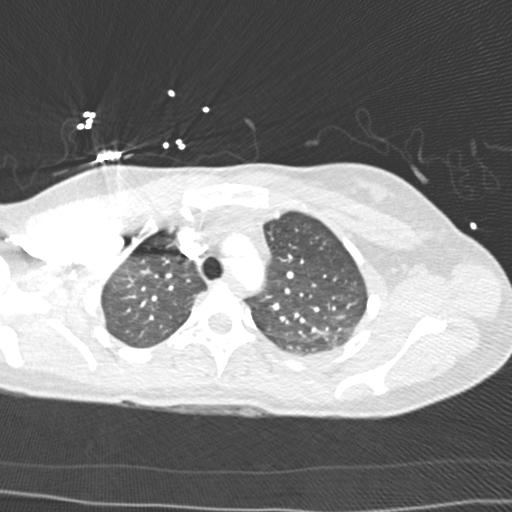
[im 160/217  soft-tissue]
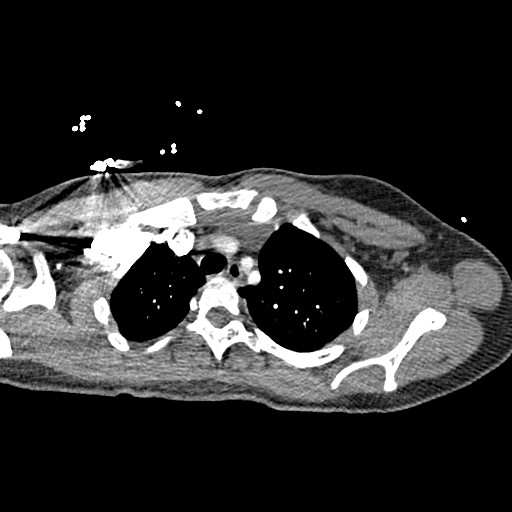
[im 179/217  lung]
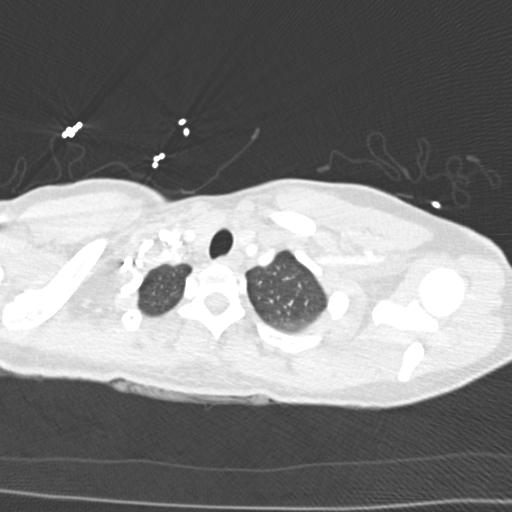
[im 188/217  soft-tissue]
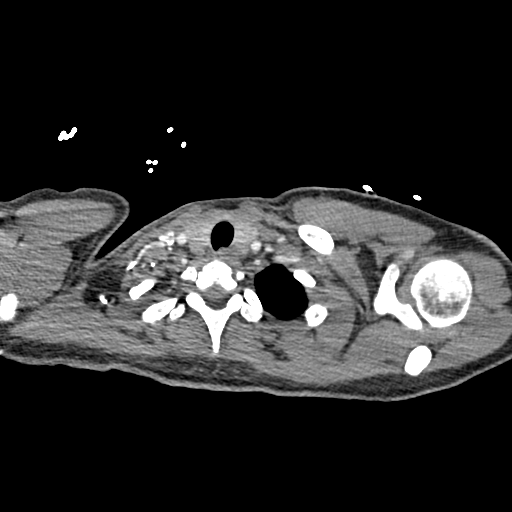
[im 207/217  lung]
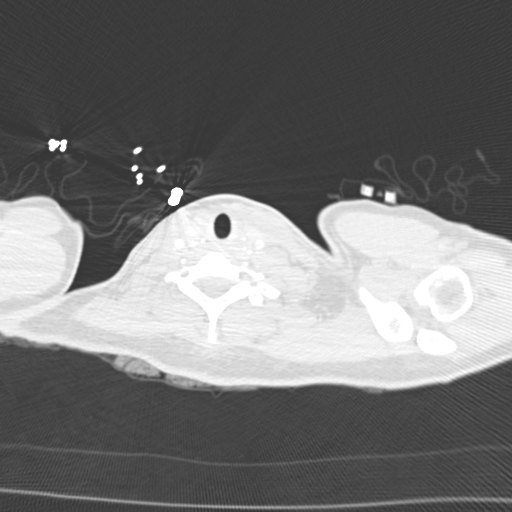

[Series 8: coronal mpr · coronal · 0.49mm/px · 3 of 98 slices shown]
[im 25/98  soft-tissue]
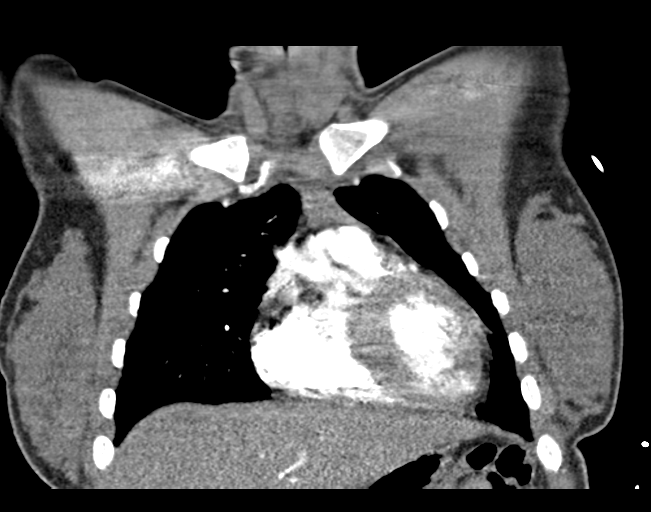
[im 49/98  soft-tissue]
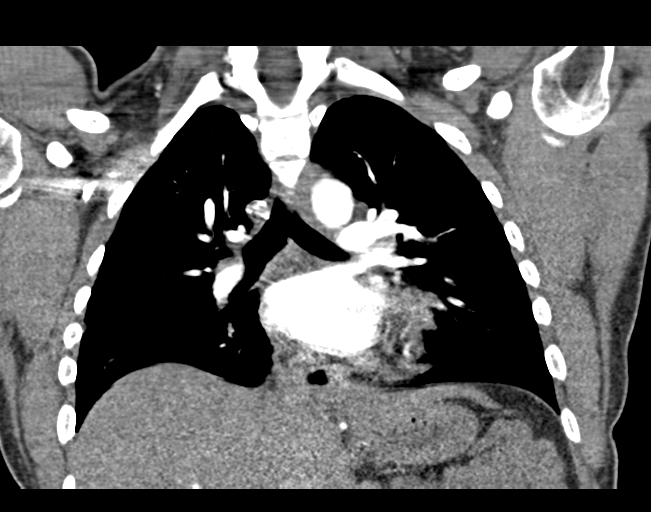
[im 73/98  soft-tissue]
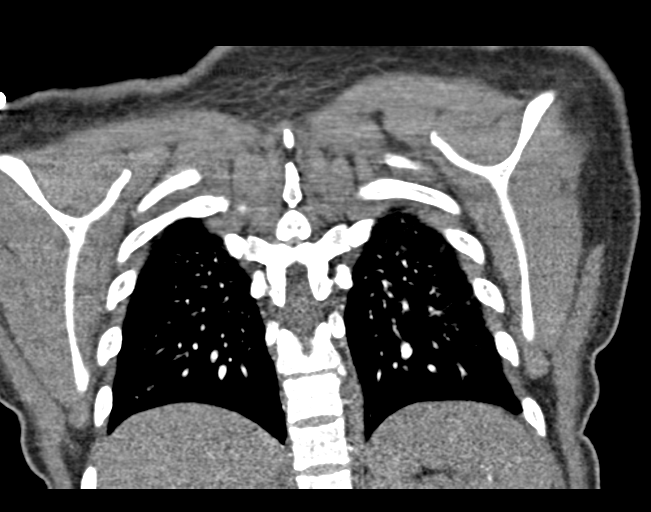

[18 of 46 positions shown; findings below may reference images not displayed]

FINDINGS: Cardiovascular: No filling defect within the pulmonary arterial tree
to suggest pulmonary embolism. Heart size is normal. There is no
significant pericardial fluid, thickening or pericardial
calcification. No atherosclerotic calcifications in the thoracic
aorta or the coronary arteries.

Mediastinum/Nodes: No pathologically enlarged mediastinal or hilar
lymph nodes. Moderate to large hiatal hernia. No axillary
lymphadenopathy.

Lungs/Pleura: No acute consolidative airspace disease. No pleural
effusions. No suspicious appearing pulmonary nodules or masses are
noted.

Upper Abdomen: Unremarkable.

Musculoskeletal: There are no aggressive appearing lytic or blastic
lesions noted in the visualized portions of the skeleton.

Review of the MIP images confirms the above findings.
IMPRESSION: 1. No acute findings are noted in the thorax to account for the
patient's symptoms.
2. Moderate to large sized hiatal hernia.
